# Patient Record
Sex: Female | Born: 1950
Health system: Southern US, Community
[De-identification: ages and names within clinical notes are randomized; demographics above are authoritative.]

## PROBLEM LIST (undated history)

## (undated) DIAGNOSIS — Z9889 Other specified postprocedural states: Secondary | ICD-10-CM

## (undated) DIAGNOSIS — H9190 Unspecified hearing loss, unspecified ear: Secondary | ICD-10-CM

## (undated) DIAGNOSIS — R112 Nausea with vomiting, unspecified: Secondary | ICD-10-CM

## (undated) DIAGNOSIS — C44622 Squamous cell carcinoma of skin of right upper limb, including shoulder: Secondary | ICD-10-CM

## (undated) DIAGNOSIS — N926 Irregular menstruation, unspecified: Secondary | ICD-10-CM

## (undated) DIAGNOSIS — R87619 Unspecified abnormal cytological findings in specimens from cervix uteri: Secondary | ICD-10-CM

## (undated) DIAGNOSIS — L989 Disorder of the skin and subcutaneous tissue, unspecified: Secondary | ICD-10-CM

## (undated) DIAGNOSIS — E785 Hyperlipidemia, unspecified: Secondary | ICD-10-CM

## (undated) HISTORY — DX: Irregular menstruation, unspecified: N92.6

## (undated) HISTORY — DX: Hyperlipidemia, unspecified: E78.5

## (undated) HISTORY — DX: Unspecified abnormal cytological findings in specimens from cervix uteri: R87.619

## (undated) HISTORY — DX: Squamous cell carcinoma of skin of right upper limb, including shoulder: C44.622

## (undated) HISTORY — DX: Disorder of the skin and subcutaneous tissue, unspecified: L98.9

## (undated) HISTORY — DX: Unspecified hearing loss, unspecified ear: H91.90

---

## 1989-02-17 HISTORY — PX: TUBAL LIGATION: SHX77

## 1993-02-17 HISTORY — PX: BREAST LUMPECTOMY: SHX2

## 1996-02-18 HISTORY — PX: COLPOSCOPY: SHX161

## 1998-11-19 ENCOUNTER — Other Ambulatory Visit: Admission: RE | Admit: 1998-11-19 | Discharge: 1998-11-19 | Payer: Self-pay | Admitting: Obstetrics and Gynecology

## 1998-12-19 HISTORY — PX: COLPOSCOPY: SHX161

## 1998-12-27 ENCOUNTER — Encounter (INDEPENDENT_AMBULATORY_CARE_PROVIDER_SITE_OTHER): Payer: Self-pay

## 1998-12-27 ENCOUNTER — Other Ambulatory Visit: Admission: RE | Admit: 1998-12-27 | Discharge: 1998-12-27 | Payer: Self-pay | Admitting: Obstetrics and Gynecology

## 1999-06-06 ENCOUNTER — Other Ambulatory Visit: Admission: RE | Admit: 1999-06-06 | Discharge: 1999-06-06 | Payer: Self-pay | Admitting: *Deleted

## 1999-11-19 ENCOUNTER — Other Ambulatory Visit: Admission: RE | Admit: 1999-11-19 | Discharge: 1999-11-19 | Payer: Self-pay | Admitting: *Deleted

## 2000-08-04 ENCOUNTER — Other Ambulatory Visit: Admission: RE | Admit: 2000-08-04 | Discharge: 2000-08-04 | Payer: Self-pay | Admitting: Obstetrics and Gynecology

## 2001-04-07 ENCOUNTER — Other Ambulatory Visit: Admission: RE | Admit: 2001-04-07 | Discharge: 2001-04-07 | Payer: Self-pay | Admitting: Obstetrics and Gynecology

## 2001-10-12 ENCOUNTER — Other Ambulatory Visit: Admission: RE | Admit: 2001-10-12 | Discharge: 2001-10-12 | Payer: Self-pay | Admitting: Obstetrics and Gynecology

## 2002-11-02 ENCOUNTER — Other Ambulatory Visit: Admission: RE | Admit: 2002-11-02 | Discharge: 2002-11-02 | Payer: Self-pay | Admitting: Obstetrics and Gynecology

## 2004-02-06 ENCOUNTER — Other Ambulatory Visit: Admission: RE | Admit: 2004-02-06 | Discharge: 2004-02-06 | Payer: Self-pay | Admitting: Obstetrics and Gynecology

## 2005-04-22 ENCOUNTER — Other Ambulatory Visit: Admission: RE | Admit: 2005-04-22 | Discharge: 2005-04-22 | Payer: Self-pay | Admitting: Obstetrics and Gynecology

## 2005-07-04 HISTORY — PX: COLONOSCOPY: SHX174

## 2005-07-04 LAB — HM COLONOSCOPY: HM Colonoscopy: NORMAL

## 2006-05-25 ENCOUNTER — Other Ambulatory Visit: Admission: RE | Admit: 2006-05-25 | Discharge: 2006-05-25 | Payer: Self-pay | Admitting: Obstetrics and Gynecology

## 2006-06-19 LAB — HM DEXA SCAN

## 2007-01-18 DIAGNOSIS — L989 Disorder of the skin and subcutaneous tissue, unspecified: Secondary | ICD-10-CM

## 2007-01-18 HISTORY — DX: Disorder of the skin and subcutaneous tissue, unspecified: L98.9

## 2007-06-22 ENCOUNTER — Other Ambulatory Visit: Admission: RE | Admit: 2007-06-22 | Discharge: 2007-06-22 | Payer: Self-pay | Admitting: Obstetrics and Gynecology

## 2012-05-07 ENCOUNTER — Encounter: Payer: Self-pay | Admitting: Family Medicine

## 2012-05-07 ENCOUNTER — Ambulatory Visit (INDEPENDENT_AMBULATORY_CARE_PROVIDER_SITE_OTHER): Payer: BC Managed Care – PPO | Admitting: Family Medicine

## 2012-05-07 VITALS — BP 120/90 | HR 88 | Temp 98.3°F | Resp 16 | Wt 180.0 lb

## 2012-05-07 DIAGNOSIS — IMO0001 Reserved for inherently not codable concepts without codable children: Secondary | ICD-10-CM

## 2012-05-07 DIAGNOSIS — M791 Myalgia, unspecified site: Secondary | ICD-10-CM

## 2012-05-07 NOTE — Progress Notes (Signed)
Subjective:     [name], female   DOB: 09-25-1950, 62 y.o.   MRN: [id number]  HPI 62 year old white female here for followup for her myalgias.  At last office visit she complained of gradually worsening muscle aches in both shoulders and both hips.  He denies any injury, fever, or temporal headache.  She denied any pain in knees, hips, ankles, feet,or hands.  Lab workup included a normal ANA, sed rate, Lyme titers, CBC, CMP.  I asked her to try prednisone 20 mg by mouth daily and follow up today to determine its effect.  She did not get the prednisone.  However she states she is doing better.  Review of Systems  Constitutional: Negative.   HENT: Negative.   Eyes: Negative.   Respiratory: Negative.   Cardiovascular: Negative.   Gastrointestinal: Negative.   Genitourinary: Negative.   Musculoskeletal: Positive for myalgias and arthralgias.       Objective:   Physical Exam  Constitutional: She appears well-developed and well-nourished.  HENT:  Head: Normocephalic and atraumatic.  Neck: Normal range of motion. Neck supple. No thyromegaly present.  Cardiovascular: Normal rate, regular rhythm and normal heart sounds.   Pulmonary/Chest: Effort normal and breath sounds normal. No respiratory distress. She has no wheezes. She has no rales.  Abdominal: Soft. Bowel sounds are normal.  Musculoskeletal: Normal range of motion. She exhibits no edema and no tenderness.       Assessment:     Myalgias-original diagnosis includes PMR versus arthritis.    Plan:    patient would like to just monitor now clinically.  If symptoms worsen she will try a short course of prednisone.  Otherwise she will follow with me annually for a physical exam.  She is going to check her blood pressure daily and notify me if the systolic is greater than 140 or the diastolic remained greater than 90.  She states her blood pressures at home are much better.  She will watch her sodium consumption.  If  blood pressures are elevated we'll initiate treatment.

## 2012-08-05 ENCOUNTER — Encounter: Payer: Self-pay | Admitting: *Deleted

## 2012-08-17 ENCOUNTER — Encounter: Payer: Self-pay | Admitting: Family Medicine

## 2012-08-23 ENCOUNTER — Encounter: Payer: Self-pay | Admitting: Nurse Practitioner

## 2012-08-23 ENCOUNTER — Ambulatory Visit (INDEPENDENT_AMBULATORY_CARE_PROVIDER_SITE_OTHER): Payer: BC Managed Care – PPO | Admitting: Nurse Practitioner

## 2012-08-23 VITALS — BP 122/60 | HR 74 | Resp 14 | Ht 67.5 in | Wt 176.6 lb

## 2012-08-23 DIAGNOSIS — Z0000 Encounter for general adult medical examination without abnormal findings: Secondary | ICD-10-CM

## 2012-08-23 DIAGNOSIS — Z01419 Encounter for gynecological examination (general) (routine) without abnormal findings: Secondary | ICD-10-CM

## 2012-08-23 DIAGNOSIS — E559 Vitamin D deficiency, unspecified: Secondary | ICD-10-CM

## 2012-08-23 LAB — COMPREHENSIVE METABOLIC PANEL
ALT: 17 U/L (ref 0–35)
AST: 21 U/L (ref 0–37)
Albumin: 4.3 g/dL (ref 3.5–5.2)
Alkaline Phosphatase: 84 U/L (ref 39–117)
BUN: 12 mg/dL (ref 6–23)
Calcium: 9.7 mg/dL (ref 8.4–10.5)
Chloride: 104 mEq/L (ref 96–112)
Potassium: 4.6 mEq/L (ref 3.5–5.3)
Sodium: 141 mEq/L (ref 135–145)

## 2012-08-23 LAB — POCT URINALYSIS DIPSTICK
Spec Grav, UA: 1.02
Urobilinogen, UA: NEGATIVE
pH, UA: 5.5

## 2012-08-23 LAB — LIPID PANEL
LDL Cholesterol: 170 mg/dL — ABNORMAL HIGH (ref 0–99)
Total CHOL/HDL Ratio: 5 Ratio
VLDL: 23 mg/dL (ref 0–40)

## 2012-08-23 NOTE — Progress Notes (Signed)
62 y.o. G2P2 Married Caucasian Fe here for annual exam.  She feels well. No new health problems.  No LMP recorded. Patient is postmenopausal.          Sexually active: yes  The current method of family planning is tubal ligation.    Exercising: yes  walking and gardening Smoker:  no  Health Maintenance: Pap:  07/31/2011 normal with negative HR HPV MMG:  07/2012 normal Colonoscopy:  2007 2 polyps and recheck in 10 years BMD:   06/19/2006 normal TDaP:  07/29/2010 Labs: Hgb- 13.0    reports that she has never smoked. She has never used smokeless tobacco. She reports that she does not drink alcohol or use illicit drugs.  Past Medical History  Diagnosis Date  . Melanoma 01/2007    skin lesion removed from left leg  . Irregular menses   . Hearing loss     bilateral hearing loss    Past Surgical History  Procedure Laterality Date  . Tubal ligation Bilateral 1991  . Breast lumpectomy Left 1995    benign  . Colonoscopy  07/04/2005  . Colposcopy  02/1996  . Colposcopy  12/1998    benign    Current Outpatient Prescriptions  Medication Sig Dispense Refill  . acetaminophen (TYLENOL) 100 MG/ML solution Take 10 mg/kg by mouth every 4 (four) hours as needed for fever.      . Multiple Vitamin (MULTIVITAMIN) tablet Take 1 tablet by mouth daily.      . Vitamin D, Ergocalciferol, (DRISDOL) 50000 UNITS CAPS Take 50,000 Units by mouth every 7 (seven) days.       No current facility-administered medications for this visit.    Family History  Problem Relation Age of Onset  . Hypertension Father   . Cancer Paternal Aunt 61    colon cancer  . Breast cancer Paternal Aunt   . Diabetes Paternal Grandmother     ROS:  Pertinent items are noted in HPI.  Otherwise, a comprehensive ROS was negative.  Exam:   BP 122/60  Pulse 74  Resp 14  Ht 5' 7.5" (1.715 m)  Wt 176 lb 9.6 oz (80.105 kg)  BMI 27.24 kg/m2 Height: 5' 7.5" (171.5 cm)  Ht Readings from Last 3 Encounters:  08/23/12 5' 7.5" (1.715  m)    General appearance: alert, cooperative and appears stated age Head: Normocephalic, without obvious abnormality, atraumatic Neck: no adenopathy, supple, symmetrical, trachea midline and thyroid normal to inspection and palpation Lungs: clear to auscultation bilaterally Breasts: normal appearance, no masses or tenderness Heart: regular rate and rhythm Abdomen: soft, non-tender; no masses,  no organomegaly Extremities: extremities normal, atraumatic, no cyanosis or edema Skin: Skin color, texture, turgor normal. No rashes or lesions Lymph nodes: Cervical, supraclavicular, and axillary nodes normal. No abnormal inguinal nodes palpated Neurologic: Grossly normal   Pelvic: External genitalia:  no lesions              Urethra:  normal appearing urethra with no masses, tenderness or lesions              Bartholin's and Skene's: normal                 Vagina: normal appearing vagina with normal color and discharge, no lesions              Cervix: anteverted              Pap taken: no Bimanual Exam:  Uterus:  normal size, contour, position, consistency, mobility,  non-tender              Adnexa: no mass, fullness, tenderness               Rectovaginal: Confirms               Anus:  normal sphincter tone, no lesions  A:  Well Woman with normal exam  Postmenopausal no HRT  Vit D deficiency  History of bilateral hearing loss  Personal history of colon polyps  Remote history of abnormal pap with negative Colpo biopsy 11/00    P:   Pap smear as per guidelines   Mammogram due 6/15  IFOB given  Follow with labs return annually or prn  An After Visit Summary was printed and given to the patient.

## 2012-08-23 NOTE — Patient Instructions (Addendum)

## 2012-08-24 ENCOUNTER — Encounter: Payer: Self-pay | Admitting: Nurse Practitioner

## 2012-08-24 NOTE — Progress Notes (Signed)
Encounter reviewed by Dr. Brook Silva.  

## 2012-09-02 ENCOUNTER — Telehealth: Payer: Self-pay | Admitting: *Deleted

## 2012-09-02 NOTE — Telephone Encounter (Signed)
Pt is aware of all lab results and will consult her PCP about cholesterol. Pt is agreeable to Vitamin D 600-800 IU 1 po qd (otc).

## 2012-09-02 NOTE — Telephone Encounter (Signed)
Message copied by Osie Bond on Thu Sep 02, 2012  1:35 PM ------      Message from: Ria Comment R      Created: Thu Sep 02, 2012  8:56 AM       Let patient know that total cholesterol and LDL is still elevated - would recommend that she see PCP about further treatment options .All else is good. Follow Vit D per protocol. ------

## 2012-09-07 ENCOUNTER — Encounter: Payer: Self-pay | Admitting: Family Medicine

## 2012-11-25 ENCOUNTER — Ambulatory Visit (INDEPENDENT_AMBULATORY_CARE_PROVIDER_SITE_OTHER): Payer: BC Managed Care – PPO | Admitting: *Deleted

## 2012-11-25 VITALS — Temp 97.2°F

## 2012-11-25 DIAGNOSIS — Z23 Encounter for immunization: Secondary | ICD-10-CM

## 2012-12-21 NOTE — Addendum Note (Signed)
Addended by: Lorraine Lax on: 12/21/2012 09:30 AM   Modules accepted: Orders

## 2013-02-04 ENCOUNTER — Ambulatory Visit (INDEPENDENT_AMBULATORY_CARE_PROVIDER_SITE_OTHER): Payer: BC Managed Care – PPO | Admitting: Family Medicine

## 2013-02-04 ENCOUNTER — Encounter: Payer: Self-pay | Admitting: Family Medicine

## 2013-02-04 VITALS — BP 138/88 | HR 78 | Temp 98.6°F | Resp 18 | Ht 67.0 in | Wt 172.0 lb

## 2013-02-04 DIAGNOSIS — R030 Elevated blood-pressure reading, without diagnosis of hypertension: Secondary | ICD-10-CM | POA: Insufficient documentation

## 2013-02-04 DIAGNOSIS — E785 Hyperlipidemia, unspecified: Secondary | ICD-10-CM

## 2013-02-04 NOTE — Patient Instructions (Addendum)
F/U 3 months - Fasting  DASH Diet The DASH diet stands for "Dietary Approaches to Stop Hypertension." It is a healthy eating plan that has been shown to reduce high blood pressure (hypertension) in as little as 14 days, while also possibly providing other significant health benefits. These other health benefits include reducing the risk of breast cancer after menopause and reducing the risk of type 2 diabetes, heart disease, colon cancer, and stroke. Health benefits also include weight loss and slowing kidney failure in patients with chronic kidney disease.  DIET GUIDELINES  Limit salt (sodium). Your diet should contain less than 1500 mg of sodium daily.  Limit refined or processed carbohydrates. Your diet should include mostly whole grains. Desserts and added sugars should be used sparingly.  Include small amounts of heart-healthy fats. These types of fats include nuts, oils, and tub margarine. Limit saturated and trans fats. These fats have been shown to be harmful in the body. CHOOSING FOODS  The following food groups are based on a 2000 calorie diet. See your Registered Dietitian for individual calorie needs. Grains and Grain Products (6 to 8 servings daily)  Eat More Often: Whole-wheat bread, brown rice, whole-grain or wheat pasta, quinoa, popcorn without added fat or salt (air popped).  Eat Less Often: White bread, white pasta, white rice, cornbread. Vegetables (4 to 5 servings daily)  Eat More Often: Fresh, frozen, and canned vegetables. Vegetables may be raw, steamed, roasted, or grilled with a minimal amount of fat.  Eat Less Often/Avoid: Creamed or fried vegetables. Vegetables in a cheese sauce. Fruit (4 to 5 servings daily)  Eat More Often: All fresh, canned (in natural juice), or frozen fruits. Dried fruits without added sugar. One hundred percent fruit juice ( cup [237 mL] daily).  Eat Less Often: Dried fruits with added sugar. Canned fruit in light or heavy syrup. [employer], Fish, and Poultry (2 servings or less daily. One serving is 3 to 4 oz [85-114 g]).  Eat More Often: Ninety percent or leaner ground beef, tenderloin, sirloin. Round cuts of beef, chicken breast, [place] breast. All fish. Grill, bake, or broil your meat. Nothing should be fried.  Eat Less Often/Avoid: Fatty cuts of meat, [place], or chicken leg, thigh, or wing. Fried cuts of meat or fish. Dairy (2 to 3 servings)  Eat More Often: Low-fat or fat-free milk, low-fat plain or light yogurt, reduced-fat or part-skim cheese.  Eat Less Often/Avoid: Milk (whole, 2%).Whole milk yogurt. Full-fat cheeses. Nuts, Seeds, and Legumes (4 to 5 servings per week)  Eat More Often: All without added salt.  Eat Less Often/Avoid: Salted nuts and seeds, canned beans with added salt. Fats and Sweets (limited)  Eat More Often: Vegetable oils, tub margarines without trans fats, sugar-free gelatin. Mayonnaise and salad dressings.  Eat Less Often/Avoid: Coconut oils, palm oils, butter, stick margarine, cream, half and half, cookies, candy, pie. FOR MORE INFORMATION The Dash Diet Eating Plan: www.dashdiet.org Document Released: 01/23/2011 Document Revised: 04/28/2011 Document Reviewed: 01/23/2011 [facility] Patient Information 2014 [place], [place]. DASH Diet The DASH diet stands for "Dietary Approaches to Stop Hypertension." It is a healthy eating plan that has been shown to reduce high blood pressure (hypertension) in as little as 14 days, while also possibly providing other significant health benefits. These other health benefits include reducing the risk of breast cancer after menopause and reducing the risk of type 2 diabetes, heart disease, colon cancer, and stroke. Health benefits also include weight loss and slowing kidney failure in patients with  chronic kidney disease.  DIET GUIDELINES  Limit salt (sodium). Your diet should contain less than 1500 mg of sodium daily.  Limit refined or processed  carbohydrates. Your diet should include mostly whole grains. Desserts and added sugars should be used sparingly.  Include small amounts of heart-healthy fats. These types of fats include nuts, oils, and tub margarine. Limit saturated and trans fats. These fats have been shown to be harmful in the body. CHOOSING FOODS  The following food groups are based on a 2000 calorie diet. See your Registered Dietitian for individual calorie needs. Grains and Grain Products (6 to 8 servings daily)  Eat More Often: Whole-wheat bread, brown rice, whole-grain or wheat pasta, quinoa, popcorn without added fat or salt (air popped).  Eat Less Often: White bread, white pasta, white rice, cornbread. Vegetables (4 to 5 servings daily)  Eat More Often: Fresh, frozen, and canned vegetables. Vegetables may be raw, steamed, roasted, or grilled with a minimal amount of fat.  Eat Less Often/Avoid: Creamed or fried vegetables. Vegetables in a cheese sauce. Fruit (4 to 5 servings daily)  Eat More Often: All fresh, canned (in natural juice), or frozen fruits. Dried fruits without added sugar. One hundred percent fruit juice ( cup [237 mL] daily).  Eat Less Often: Dried fruits with added sugar. Canned fruit in light or heavy syrup. [employer], Fish, and Poultry (2 servings or less daily. One serving is 3 to 4 oz [85-114 g]).  Eat More Often: Ninety percent or leaner ground beef, tenderloin, sirloin. Round cuts of beef, chicken breast, [place] breast. All fish. Grill, bake, or broil your meat. Nothing should be fried.  Eat Less Often/Avoid: Fatty cuts of meat, [place], or chicken leg, thigh, or wing. Fried cuts of meat or fish. Dairy (2 to 3 servings)  Eat More Often: Low-fat or fat-free milk, low-fat plain or light yogurt, reduced-fat or part-skim cheese.  Eat Less Often/Avoid: Milk (whole, 2%).Whole milk yogurt. Full-fat cheeses. Nuts, Seeds, and Legumes (4 to 5 servings per week)  Eat More Often: All without added  salt.  Eat Less Often/Avoid: Salted nuts and seeds, canned beans with added salt. Fats and Sweets (limited)  Eat More Often: Vegetable oils, tub margarines without trans fats, sugar-free gelatin. Mayonnaise and salad dressings.  Eat Less Often/Avoid: Coconut oils, palm oils, butter, stick margarine, cream, half and half, cookies, candy, pie. FOR MORE INFORMATION The Dash Diet Eating Plan: www.dashdiet.org Document Released: 01/23/2011 Document Revised: 04/28/2011 Document Reviewed: 01/23/2011 [facility] Patient Information 2014 [place], [place].

## 2013-02-04 NOTE — Progress Notes (Signed)
   Subjective:    [name], female    DOB: [birth year], 61 y.o.   MRN: [id number]  HPI  Patient here to followup her blood pressure. On her last visit with PCP her diastolic blood pressure was elevated around 90. She's been checking her blood pressures at home over the past couple of months. She did have appeared time between June and September where she was caring for her mother therefore she has no readings. Otherwise her blood pressures have been 120s to 154 over 60s to 80. The past few weeks her blood pressures on average have been 144/70's. She's not had any chest pain or shortness of breath or headache associated. She understands she has been under some stress caring for her mother as well as daily stressors. She tries to eat well and she walks 2 miles a day. Her fasting labs were reviewed from her GI and which did show hyperlipidemia with an LDL of 170. She does have family history of hypertension in her father and her brother  Review of Systems  GEN- denies fatigue, fever, weight loss,weakness, recent illness HEENT- denies eye drainage, change in vision, nasal discharge, CVS- denies chest pain, palpitations RESP- denies SOB, cough, wheeze ABD- denies N/V, change in stools, abd pain GU- denies dysuria, hematuria, dribbling, incontinence MSK- denies joint pain, muscle aches, injury Neuro- denies headache, dizziness, syncope, seizure activity      Objective:   Physical Exam GEN- NAD, alert and oriented x3 HEENT- PERRL, EOMI, non injected sclera, pink conjunctiva, MMM, oropharynx clear CVS- RRR, no murmur RESP-CTAB EXT- No edema Pulses- Radial 2+        Assessment & Plan:

## 2013-02-04 NOTE — Assessment & Plan Note (Signed)
Her blood pressure is minimally elevated in office today. She has had a few elevated blood pressures at home. We discussed more south changes that she can do to bring her blood pressure down into protect her from complications of hypertension. We discussed low sat diet as well as aerobic exercise and stress relief. Do not think that she needs medications at this time. She will continue to monitor her blood pressure and will followup in 3 months time she will also need fasting labs

## 2013-02-04 NOTE — Assessment & Plan Note (Signed)
Fasting labs will be done patient will work on dietary changes low fat diet.

## 2013-05-06 ENCOUNTER — Encounter: Payer: Self-pay | Admitting: Family Medicine

## 2013-05-06 ENCOUNTER — Ambulatory Visit (INDEPENDENT_AMBULATORY_CARE_PROVIDER_SITE_OTHER): Payer: BC Managed Care – PPO | Admitting: Family Medicine

## 2013-05-06 VITALS — BP 138/62 | HR 76 | Temp 98.2°F | Resp 14 | Ht 68.0 in | Wt 176.0 lb

## 2013-05-06 DIAGNOSIS — E785 Hyperlipidemia, unspecified: Secondary | ICD-10-CM

## 2013-05-06 DIAGNOSIS — E559 Vitamin D deficiency, unspecified: Secondary | ICD-10-CM | POA: Insufficient documentation

## 2013-05-06 DIAGNOSIS — R030 Elevated blood-pressure reading, without diagnosis of hypertension: Secondary | ICD-10-CM

## 2013-05-06 LAB — CBC WITH DIFFERENTIAL/PLATELET
Basophils Absolute: 0 10*3/uL (ref 0.0–0.1)
Basophils Relative: 1 % (ref 0–1)
Eosinophils Absolute: 0.1 10*3/uL (ref 0.0–0.7)
Eosinophils Relative: 3 % (ref 0–5)
HEMATOCRIT: 39.2 % (ref 36.0–46.0)
HEMOGLOBIN: 13.1 g/dL (ref 12.0–15.0)
LYMPHS ABS: 1.4 10*3/uL (ref 0.7–4.0)
Lymphocytes Relative: 30 % (ref 12–46)
MCH: 28.7 pg (ref 26.0–34.0)
MCHC: 33.4 g/dL (ref 30.0–36.0)
MCV: 85.8 fL (ref 78.0–100.0)
MONO ABS: 0.3 10*3/uL (ref 0.1–1.0)
MONOS PCT: 6 % (ref 3–12)
NEUTROS ABS: 2.9 10*3/uL (ref 1.7–7.7)
NEUTROS PCT: 60 % (ref 43–77)
Platelets: 250 10*3/uL (ref 150–400)
RBC: 4.57 MIL/uL (ref 3.87–5.11)
RDW: 13.6 % (ref 11.5–15.5)
WBC: 4.8 10*3/uL (ref 4.0–10.5)

## 2013-05-06 LAB — COMPREHENSIVE METABOLIC PANEL
ALBUMIN: 4.1 g/dL (ref 3.5–5.2)
ALT: 15 U/L (ref 0–35)
AST: 21 U/L (ref 0–37)
Alkaline Phosphatase: 90 U/L (ref 39–117)
BUN: 11 mg/dL (ref 6–23)
CALCIUM: 9.5 mg/dL (ref 8.4–10.5)
CHLORIDE: 102 meq/L (ref 96–112)
CO2: 29 meq/L (ref 19–32)
Creat: 0.9 mg/dL (ref 0.50–1.10)
GLUCOSE: 93 mg/dL (ref 70–99)
POTASSIUM: 4.7 meq/L (ref 3.5–5.3)
SODIUM: 140 meq/L (ref 135–145)
TOTAL PROTEIN: 6.8 g/dL (ref 6.0–8.3)
Total Bilirubin: 0.4 mg/dL (ref 0.2–1.2)

## 2013-05-06 LAB — LIPID PANEL
CHOLESTEROL: 223 mg/dL — AB (ref 0–200)
HDL: 53 mg/dL (ref >39)
LDL CALC: 150 mg/dL — AB (ref 0–99)
TRIGLYCERIDES: 101 mg/dL (ref <150)
Total CHOL/HDL Ratio: 4.2 Ratio
VLDL: 20 mg/dL (ref 0–40)

## 2013-05-06 NOTE — Patient Instructions (Signed)
Continue current vitamins We will send letter with lab results F/U 1 year or as needed

## 2013-05-06 NOTE — Progress Notes (Signed)
[name], female   DOB: 1950/09/25, 63 y.o.   MRN: [id number]   Subjective:    [name], female    DOB: 1950/09/09, 63 y.o.   MRN: [id number]  Patient presents for 3 month F/U  patient here to followup hypertension. She's no specific concerns today. She has been checking her blood pressure randomly at home her blood pressure has ranged 122-145/70-76 she's not had any headaches or dizziness. She states that she feels well she is trying to be active and eat healthy. She has gained 4 pounds since her last visit. She's due for fasting labs she is history of hyperlipidemia. She also has history of vitamin D deficiency and is due for repeat vitamin D level currently taking a maintenance dose of 1000 international units a day     Review Of Systems:  GEN- denies fatigue, fever, weight loss,weakness, recent illness HEENT- denies eye drainage, change in vision, nasal discharge, CVS- denies chest pain, palpitations RESP- denies SOB, cough, wheeze ABD- denies N/V, change in stools, abd pain GU- denies dysuria, hematuria, dribbling, incontinence MSK- denies joint pain, muscle aches, injury Neuro- denies headache, dizziness, syncope, seizure activity       Objective:    BP 138/62  Pulse 76  Temp(Src) 98.2 F (36.8 C)  Resp 14  Ht 5\' 8"  (1.727 m)  Wt 176 lb (79.833 kg)  BMI 26.77 kg/m2 GEN- NAD, alert and oriented x3 HEENT- PERRL, EOMI, non injected sclera, pink conjunctiva, MMM, oropharynx clear Neck- Supple, no thyromegaly CVS- RRR, no murmur RESP-CTAB EXT- No edema Pulses- Radial, DP- 2+        Assessment & Plan:      Problem List Items Addressed This Visit   Unspecified vitamin D deficiency   Relevant Orders      Vit D  25 hydroxy (rtn osteoporosis monitoring)   Other and unspecified hyperlipidemia - Primary     Check FLP    Relevant Orders      CBC with Differential      Comprehensive metabolic panel      Lipid panel   Elevated  blood pressure (not hypertension)     Blood pressure are wel controlled, no signs of htn, she can continue to monitor as needed Fasting labs toda       Note: This dictation was prepared with Dragon dictation along with smaller phrase technology. Any transcriptional errors that result from this process are unintentional.

## 2013-05-06 NOTE — Assessment & Plan Note (Signed)
Blood pressure are wel controlled, no signs of htn, she can continue to monitor as needed Fasting labs toda

## 2013-05-06 NOTE — Assessment & Plan Note (Signed)
Check FLP 

## 2013-05-07 LAB — VITAMIN D 25 HYDROXY (VIT D DEFICIENCY, FRACTURES): Vit D, 25-Hydroxy: 53 ng/mL (ref 30–89)

## 2013-05-09 ENCOUNTER — Other Ambulatory Visit: Payer: Self-pay | Admitting: *Deleted

## 2013-05-09 DIAGNOSIS — E785 Hyperlipidemia, unspecified: Secondary | ICD-10-CM

## 2013-09-13 ENCOUNTER — Encounter: Payer: Self-pay | Admitting: Family Medicine

## 2013-10-17 ENCOUNTER — Other Ambulatory Visit: Payer: BC Managed Care – PPO

## 2013-10-26 ENCOUNTER — Ambulatory Visit (INDEPENDENT_AMBULATORY_CARE_PROVIDER_SITE_OTHER): Payer: BC Managed Care – PPO | Admitting: Nurse Practitioner

## 2013-10-26 ENCOUNTER — Encounter: Payer: Self-pay | Admitting: Nurse Practitioner

## 2013-10-26 VITALS — BP 124/80 | HR 76 | Ht 67.75 in | Wt 172.0 lb

## 2013-10-26 DIAGNOSIS — E2839 Other primary ovarian failure: Secondary | ICD-10-CM

## 2013-10-26 DIAGNOSIS — Z0000 Encounter for general adult medical examination without abnormal findings: Secondary | ICD-10-CM

## 2013-10-26 DIAGNOSIS — R3915 Urgency of urination: Secondary | ICD-10-CM

## 2013-10-26 DIAGNOSIS — Z01419 Encounter for gynecological examination (general) (routine) without abnormal findings: Secondary | ICD-10-CM

## 2013-10-26 DIAGNOSIS — Z1211 Encounter for screening for malignant neoplasm of colon: Secondary | ICD-10-CM

## 2013-10-26 LAB — POCT URINALYSIS DIPSTICK
Bilirubin, UA: NEGATIVE
Blood, UA: NEGATIVE
Glucose, UA: NEGATIVE
KETONES UA: NEGATIVE
Nitrite, UA: NEGATIVE
Protein, UA: NEGATIVE
Urobilinogen, UA: NEGATIVE
pH, UA: 7

## 2013-10-26 NOTE — Progress Notes (Signed)
[name], female   DOB: [birth year], 63 y.o.   MRN: [id number] 63 y.o. G2P2 Married Caucasian Fe here for annual exam.  No new health problems.  Patient's last menstrual period was 11/17/2001.          Sexually active: yes  The current method of family planning is tubal ligation.  Exercising: yes walking 2 miles 5 times per week and gardening  Smoker: no   Health Maintenance:  Pap: 07/31/2011 normal with negative HR HPV  MMG: 08/29/13, Bi-Rads 1:  Negative  Colonoscopy: 2007 2 polyps and recheck in 10 years, IFOB given BMD: 06/19/2006 normal  TDaP: 07/29/2010  Labs:  HB:  PCP  Urine:  Trace WBC   reports that she has never smoked. She has never used smokeless tobacco. She reports that she does not drink alcohol or use illicit drugs.  Past Medical History  Diagnosis Date  . Irregular menses   . Hearing loss since childhood    bilateral hearing loss  . Skin lesion of left leg 12/08    atypical    Past Surgical History  Procedure Laterality Date  . Tubal ligation Bilateral 1991  . Breast lumpectomy Left 1995    benign  . Colonoscopy  07/04/2005    recheck in 10 years - Dr Collene Mares  . Colposcopy  02/1996    benign  . Colposcopy  12/1998    benign    Current Outpatient Prescriptions  Medication Sig Dispense Refill  . acetaminophen (TYLENOL) 100 MG/ML solution Take 10 mg/kg by mouth every 4 (four) hours as needed for fever.      . cholecalciferol (VITAMIN D) 1000 UNITS tablet Take 1,000 Units by mouth daily. 2 tabs Daily      . Multiple Vitamin (MULTIVITAMIN) tablet Take 1 tablet by mouth daily.       No current facility-administered medications for this visit.    Family History  Problem Relation Age of Onset  . Hypertension Father   . Cancer Paternal Aunt 75    colon cancer  . Diabetes Paternal Grandmother   . Hypertension Mother   . Thyroid disease Mother   . Hypertension Brother   . Breast cancer Paternal Aunt     ROS:  Pertinent items are noted in HPI.   Otherwise, a comprehensive ROS was negative.  Exam:   BP 124/80  Pulse 76  Ht 5' 7.75" (1.721 m)  Wt 172 lb (78.019 kg)  BMI 26.34 kg/m2  LMP 11/17/2001 Height: 5' 7.75" (172.1 cm)  Ht Readings from Last 3 Encounters:  10/26/13 5' 7.75" (1.721 m)  05/06/13 5\' 8"  (1.727 m)  02/04/13 5\' 7"  (1.702 m)    General appearance: alert, cooperative and appears stated age Head: Normocephalic, without obvious abnormality, atraumatic Neck: no adenopathy, supple, symmetrical, trachea midline and thyroid normal to inspection and palpation Lungs: clear to auscultation bilaterally Breasts: normal appearance, no masses or tenderness Heart: regular rate and rhythm Abdomen: soft, non-tender; no masses,  no organomegaly Extremities: extremities normal, atraumatic, no cyanosis or edema Skin: Skin color, texture, turgor normal. No rashes or lesions Lymph nodes: Cervical, supraclavicular, and axillary nodes normal. No abnormal inguinal nodes palpated Neurologic: Grossly normal   Pelvic: External genitalia:  no lesions              Urethra:  normal appearing urethra with no masses, tenderness or lesions              Bartholin's and Skene's: normal  Vagina: normal appearing vagina with normal color and discharge, no lesions              Cervix: anteverted              Pap taken: No. Bimanual Exam:  Uterus:  normal size, contour, position, consistency, mobility, non-tender              Adnexa: no mass, fullness, tenderness               Rectovaginal: Confirms               Anus:  normal sphincter tone, no lesions  A:  Well Woman with normal exam  Postmenopausal  R/O UTI   P:   Reviewed health and wellness pertinent to exam  Pap smear not taken today  Mammogram is due 08/2014  Order is placed for BMD  Will follow with urine culture  Counseled on breast self exam, mammography screening, adequate intake of calcium and vitamin D, diet and exercise return annually or prn  An After  Visit Summary was printed and given to the patient.

## 2013-10-26 NOTE — Patient Instructions (Signed)

## 2013-10-27 LAB — URINE CULTURE

## 2013-10-30 NOTE — Progress Notes (Signed)
Encounter reviewed by Dr. Decorian Schuenemann Silva.  

## 2013-11-17 ENCOUNTER — Ambulatory Visit (INDEPENDENT_AMBULATORY_CARE_PROVIDER_SITE_OTHER): Payer: BC Managed Care – PPO | Admitting: Family Medicine

## 2013-11-17 ENCOUNTER — Other Ambulatory Visit: Payer: BC Managed Care – PPO

## 2013-11-17 ENCOUNTER — Other Ambulatory Visit: Payer: Self-pay | Admitting: Family Medicine

## 2013-11-17 DIAGNOSIS — E785 Hyperlipidemia, unspecified: Secondary | ICD-10-CM

## 2013-11-17 DIAGNOSIS — Z23 Encounter for immunization: Secondary | ICD-10-CM

## 2013-11-17 LAB — BASIC METABOLIC PANEL
BUN: 13 mg/dL (ref 6–23)
CALCIUM: 9.4 mg/dL (ref 8.4–10.5)
CHLORIDE: 105 meq/L (ref 96–112)
CO2: 27 meq/L (ref 19–32)
Creat: 0.88 mg/dL (ref 0.50–1.10)
GLUCOSE: 83 mg/dL (ref 70–99)
Potassium: 4.5 mEq/L (ref 3.5–5.3)
Sodium: 142 mEq/L (ref 135–145)

## 2013-11-23 ENCOUNTER — Telehealth: Payer: Self-pay | Admitting: Family Medicine

## 2013-11-23 NOTE — Telephone Encounter (Signed)
Let pt know her GYN ordered this The metaolic panel was normal- kidney function , glucose and potassium

## 2013-11-23 NOTE — Telephone Encounter (Signed)
MD please advise

## 2013-11-23 NOTE — Telephone Encounter (Signed)
Call placed to patient.   During conversation she states that she was supposed to have lpid panel instead of BMP.   MD please advise.

## 2013-11-23 NOTE — Telephone Encounter (Signed)
My mistake regarding GYN, it seems our office ordered the wrong test a BMP was pulled instead of a Lipid Panel Please see if [facility] can add on the lipid panel, if not pt needs to have redrawn but should not have to pay for the test as it was our error.  I will send this to our Office manager to help facilitate this

## 2013-11-23 NOTE — Telephone Encounter (Signed)
Patient is calling to see if lab results were in [phone]

## 2013-11-23 NOTE — Telephone Encounter (Signed)
Call placed to patient and patient made aware.  

## 2013-11-24 LAB — LIPID PANEL
Cholesterol: 204 mg/dL — ABNORMAL HIGH (ref 0–200)
HDL: 51 mg/dL (ref >39)
LDL Cholesterol: 130 mg/dL — ABNORMAL HIGH (ref 0–99)
Total CHOL/HDL Ratio: 4 Ratio
Triglycerides: 113 mg/dL (ref <150)
VLDL: 23 mg/dL (ref 0–40)

## 2013-11-24 NOTE — Telephone Encounter (Signed)
Lab aware and will call to see if we can add Lipid Panel.

## 2013-11-24 NOTE — Telephone Encounter (Signed)
Per lab, Lipid panel was added.

## 2013-11-25 NOTE — Telephone Encounter (Signed)
Patient is aware that she might receive a bill for this lab. I did let the patient know that we would fix it on our end since it was drawn in error.

## 2013-12-19 ENCOUNTER — Encounter: Payer: Self-pay | Admitting: Nurse Practitioner

## 2014-02-07 ENCOUNTER — Encounter: Payer: Self-pay | Admitting: Nurse Practitioner

## 2014-02-07 ENCOUNTER — Telehealth: Payer: Self-pay | Admitting: Nurse Practitioner

## 2014-02-07 ENCOUNTER — Ambulatory Visit (INDEPENDENT_AMBULATORY_CARE_PROVIDER_SITE_OTHER): Payer: BC Managed Care – PPO | Admitting: Nurse Practitioner

## 2014-02-07 VITALS — BP 122/86 | HR 88 | Temp 98.0°F | Ht 67.75 in | Wt 178.0 lb

## 2014-02-07 DIAGNOSIS — R350 Frequency of micturition: Secondary | ICD-10-CM

## 2014-02-07 LAB — POCT URINALYSIS DIPSTICK
BILIRUBIN UA: NEGATIVE
Blood, UA: NEGATIVE
Glucose, UA: NEGATIVE
Ketones, UA: NEGATIVE
LEUKOCYTES UA: NEGATIVE
NITRITE UA: NEGATIVE
Protein, UA: NEGATIVE
Urobilinogen, UA: NEGATIVE
pH, UA: 7

## 2014-02-07 MED ORDER — SULFAMETHOXAZOLE-TRIMETHOPRIM 800-160 MG PO TABS: ORAL_TABLET | ORAL | Status: DC

## 2014-02-07 NOTE — Telephone Encounter (Signed)
Pt states she is having frequent urination and abdominal pain lower pelvic area. Pt states not extreme but lasting for several days now. Pt would like to see Patty today.  Please call.  bf

## 2014-02-07 NOTE — Patient Instructions (Signed)

## 2014-02-07 NOTE — Progress Notes (Signed)
S:  @63  y.o. MW Fe presents with complaint of UTI. Symptoms began a week ago. With symptoms of urinary frequency, urinary urgency, pelvic presssure or discomfort..  Pertinent negatives no constitutional symptoms, denying fever, chills, anorexia, or weight loss.  Not SA secondary to husbands problem with ED.  Menopausal and does have some vaginal dryness.  Same partner without change. Last UTI documented in September with mixed colony count.  ROS: no weight loss, fever, night sweats and feels well  O alert, oriented to person, place, and time   healthy,  alert and  not in acute distress  Mild lower abdominal pressure  No CVA tenderness  cervix normal in appearance, external genitalia normal, no bladder tenderness and vagina normal without discharge   Diagnostic Test:    Urinalysis:  negative   urine culture  Assessment:  R/O UTI   urethritis   Plan:   Maintain adequate hydration. Follow up if symptoms not improving, and as needed.  Medication Therapy: Septra DS BID for a week - if urine culture is negative still would like her to complete 3 days treatment for urethritis    Lab:TOC if Urine Culture is positive   RV

## 2014-02-07 NOTE — Telephone Encounter (Signed)
Spoke with patient. She c/o of urinary frequency and is concerned. Would like visit today with Rebecca Howe, [place]. Scheduled office visit today at 1015. Patient agreeable. Routing to provider for final review. Patient agreeable to disposition. Will close encounter

## 2014-02-09 LAB — URINE CULTURE
Colony Count: NO GROWTH
Organism ID, Bacteria: NO GROWTH

## 2014-02-13 NOTE — Progress Notes (Signed)
Encounter reviewed by Dr. Brook Silva.  

## 2014-02-15 LAB — FECAL OCCULT BLOOD, IMMUNOCHEMICAL: FECAL OCCULT BLD: NEGATIVE

## 2014-09-04 ENCOUNTER — Telehealth: Payer: Self-pay | Admitting: Nurse Practitioner

## 2014-09-04 NOTE — Telephone Encounter (Signed)
Note is signed - order was placed @ [facility] on 10/26/13

## 2014-09-04 NOTE — Telephone Encounter (Signed)
Order for BMD to Milford Cage, FNP's desk for review and signature before fax.

## 2014-09-04 NOTE — Telephone Encounter (Signed)
Order for BMD faxed to [facility] with cover sheet and confirmation. Spoke with patient. Advised order has been sent to [facility] for her appointment on 09/07/2014. Patient is agreeable.  Routing to provider for final review. Patient agreeable to disposition. Will close encounter.   Patient aware provider will review message and nurse will return call if any additional advice or change of disposition.

## 2014-09-04 NOTE — Telephone Encounter (Signed)
Patient is requesting an order for Bone Density . This needs to be faxed to [place] [phone] She is scheduled for this Thursday 09/07/14

## 2014-09-18 ENCOUNTER — Telehealth: Payer: Self-pay | Admitting: Nurse Practitioner

## 2014-09-18 NOTE — Telephone Encounter (Signed)
Please let pt know that BMD done on 09/07/14 shows a T Score at the spine of 0.0; left hip neck -0.3; right hip neck -0.9.  All of the these fall into the 'normal range'.  Comparison made to last study 06/19/2006 shows a 2.8% decrease at the spine and a 5.7 % decrease at the left hip.  No comparison studies for the right hip. The FRAX score for risk of major fracture in 10 years is 7.5 (goal is < 20%).  The FRAX score for hip fracture in 10 years is 0.4% (goal is < 3 %).  While some bone loss is normal and expectant in postmenopausal women we do not want the loss to get greater.  She is pretty active, so have her to increase upper body weights at 3-4 times a week and continue with walking.  Needs to maintain calcium and Vit D.  recheck in 2-3 years

## 2014-09-28 NOTE — Telephone Encounter (Signed)
Return call from pt while leaving message to return call.  Results given.  Pt is excited results are normal and agreeable with plan.  Will increase upper body weights.  Closing encounter.

## 2014-10-30 ENCOUNTER — Ambulatory Visit: Payer: BC Managed Care – PPO | Admitting: Nurse Practitioner

## 2014-12-05 ENCOUNTER — Ambulatory Visit (INDEPENDENT_AMBULATORY_CARE_PROVIDER_SITE_OTHER): Payer: BLUE CROSS/BLUE SHIELD | Admitting: Family Medicine

## 2014-12-05 DIAGNOSIS — Z23 Encounter for immunization: Secondary | ICD-10-CM

## 2014-12-12 ENCOUNTER — Ambulatory Visit (INDEPENDENT_AMBULATORY_CARE_PROVIDER_SITE_OTHER): Payer: BLUE CROSS/BLUE SHIELD | Admitting: Family Medicine

## 2014-12-12 ENCOUNTER — Encounter: Payer: Self-pay | Admitting: Family Medicine

## 2014-12-12 VITALS — BP 130/62 | HR 82 | Temp 97.7°F | Resp 16 | Ht 67.75 in | Wt 172.0 lb

## 2014-12-12 DIAGNOSIS — J209 Acute bronchitis, unspecified: Secondary | ICD-10-CM | POA: Diagnosis not present

## 2014-12-12 MED ORDER — HYDROCODONE-HOMATROPINE 5-1.5 MG/5ML PO SYRP: 5.0000 mL | ORAL_SOLUTION | Freq: Three times a day (TID) | ORAL | Status: DC | PRN

## 2014-12-12 NOTE — Progress Notes (Signed)
[name], female   DOB: 1950-06-25, 64 y.o.   MRN: [id number]   Subjective:    [name], female    DOB: December 10, 1950, 64 y.o.   MRN: [id number]  Patient presents for Illness  patient here with nonproductive tachycardia cough for the past 3 weeks. Initially started with sore throat sinus drainage but that is now resolved but she is still coughing. She would like a prescription cough medicine. Besides some fatigue from not sleeping very well she does not feel very sick. She has not had any significant fever. Denies any chest pain or shortness of breath.    Review Of Systems:  GEN- denies fatigue, fever, weight loss,weakness, recent illness HEENT- denies eye drainage, change in vision, nasal discharge, CVS- denies chest pain, palpitations RESP- denies SOB, +cough, wheeze ABD- denies N/V, change in stools, abd pain Neuro- denies headache, dizziness, syncope, seizure activity       Objective:    BP 130/62 mmHg  Pulse 82  Temp(Src) 97.7 F (36.5 C) (Oral)  Resp 16  Ht 5' 7.75" (1.721 m)  Wt 172 lb (78.019 kg)  BMI 26.34 kg/m2  LMP 11/17/2001 GEN- NAD, alert and oriented x3 HEENT- PERRL, EOMI, non injected sclera, pink conjunctiva, MMM, oropharynx clear Neck- Supple, no LAD CVS- RRR, no murmur RESP-CTAB EXT- No edema Pulses- Radial, DP- 2+        Assessment & Plan:      Problem List Items Addressed This Visit    None    Visit Diagnoses    Acute bronchitis, unspecified organism    -  Primary    Treat with hycodan cough syrup, I think she has a little post bronchitic cough from recent URI, looks well otherwise, no antibiotics needed. Mucinex during day        Note: This dictation was prepared with Dragon dictation along with smaller phrase technology. Any transcriptional errors that result from this process are unintentional.

## 2014-12-12 NOTE — Patient Instructions (Signed)
Use mucinex during the day  Take cough medicine as prescribed  F/U as previous

## 2014-12-20 ENCOUNTER — Ambulatory Visit (INDEPENDENT_AMBULATORY_CARE_PROVIDER_SITE_OTHER): Payer: BLUE CROSS/BLUE SHIELD | Admitting: Nurse Practitioner

## 2014-12-20 ENCOUNTER — Encounter: Payer: Self-pay | Admitting: Nurse Practitioner

## 2014-12-20 VITALS — BP 112/72 | HR 76 | Ht 67.25 in | Wt 172.0 lb

## 2014-12-20 DIAGNOSIS — Z0000 Encounter for general adult medical examination without abnormal findings: Secondary | ICD-10-CM | POA: Diagnosis not present

## 2014-12-20 DIAGNOSIS — Z01419 Encounter for gynecological examination (general) (routine) without abnormal findings: Secondary | ICD-10-CM | POA: Diagnosis not present

## 2014-12-20 DIAGNOSIS — Z1211 Encounter for screening for malignant neoplasm of colon: Secondary | ICD-10-CM

## 2014-12-20 NOTE — Progress Notes (Signed)
[name], female   DOB: [birth year], 64 y.o.   MRN: [id number] 64 y.o. G2P0002 Married  Caucasian Fe here for annual exam.  Mother passed in September, she was at [facility] and stayed with her at all times.  Recently treated for Bronchitis.  Still has a cough.  Patient's last menstrual period was 11/17/2001 (approximate).          Sexually active: Yes.    The current method of family planning is tubal ligation and post menopausal status.    Exercising: Yes.    Home exercise routine includes walking at least 5 times per week. Smoker:  no  Health Maintenance: Pap: 07/31/2011, Negative with negative HR HPV  MMG: 09/07/14 ROI for results Colonoscopy: 2007, 2 polyps and recheck in 10 years BMD: 09/07/14, T-Score 0.0 Spine / -0.9 Right Femur / -0.3 Left Femur TDaP: 07/29/2010  Shingles: 05/06/13 Labs:  PCP, appointment in two weeks.  Will have Vit D checked with PCP.   reports that she has never smoked. She has never used smokeless tobacco. She reports that she does not drink alcohol or use illicit drugs.  Past Medical History  Diagnosis Date  . Irregular menses   . Hearing loss since childhood    bilateral hearing loss  . Skin lesion of left leg 12/08    atypical    Past Surgical History  Procedure Laterality Date  . Tubal ligation Bilateral 1991  . Breast lumpectomy Left 1995    benign  . Colonoscopy  07/04/2005    recheck in 10 years - Dr Collene Mares  . Colposcopy  02/1996    benign  . Colposcopy  12/1998    benign    Current Outpatient Prescriptions  Medication Sig Dispense Refill  . acetaminophen (TYLENOL) 100 MG/ML solution Take 10 mg/kg by mouth every 4 (four) hours as needed for fever.    . cholecalciferol (VITAMIN D) 1000 UNITS tablet Take 1,000 Units by mouth daily. 2 tabs Daily    . Multiple Vitamin (MULTIVITAMIN) tablet Take 1 tablet by mouth daily.     No current facility-administered medications for this visit.    Family History  Problem Relation Age of  Onset  . Hypertension Father   . Cancer Paternal Aunt 55    colon cancer  . Diabetes Paternal Grandmother   . Hypertension Mother   . Thyroid disease Mother   . Hypertension Brother   . Breast cancer Paternal Aunt     ROS:  Pertinent items are noted in HPI.  Otherwise, a comprehensive ROS was negative.  Exam:   BP 112/72 mmHg  Pulse 76  Ht 5' 7.25" (1.708 m)  Wt 172 lb (78.019 kg)  BMI 26.74 kg/m2  LMP 11/17/2001 (Approximate) Height: 5' 7.25" (170.8 cm) Ht Readings from Last 3 Encounters:  12/20/14 5' 7.25" (1.708 m)  12/12/14 5' 7.75" (1.721 m)  02/07/14 5' 7.75" (1.721 m)    General appearance: alert, cooperative and appears stated age Head: Normocephalic, without obvious abnormality, atraumatic Neck: no adenopathy, supple, symmetrical, trachea midline and thyroid normal to inspection and palpation Lungs: clear to auscultation bilaterally Breasts: normal appearance, no masses or tenderness Heart: regular rate and rhythm Abdomen: soft, non-tender; no masses,  no organomegaly Extremities: extremities normal, atraumatic, no cyanosis or edema Skin: Skin color, texture, turgor normal. No rashes or lesions Lymph nodes: Cervical, supraclavicular, and axillary nodes normal. No abnormal inguinal nodes palpated Neurologic: Grossly normal   Pelvic: External genitalia:  no lesions  Urethra:  normal appearing urethra with no masses, tenderness or lesions              Bartholin's and Skene's: normal                 Vagina: normal appearing vagina with normal color and discharge, no lesions              Cervix: anteverted              Pap taken: Yes.   Bimanual Exam:  Uterus:  normal size, contour, position, consistency, mobility, non-tender              Adnexa: no mass, fullness, tenderness               Rectovaginal: Confirms               Anus:  normal sphincter tone, no lesions  Chaperone present: yes  A:  Well Woman with normal exam  Postmenopausal - no  HRT  Grief reaction to mothers death  Recent Bronchitis   P:   Reviewed health and wellness pertinent to exam  Pap smear as above  Mammogram is due 08/2015 - ROI for this year  She will be getting labs done at PCP  IFOB is given  Counseled on breast self exam, mammography screening, adequate intake of calcium and vitamin D, diet and exercise, Kegel's exercises return annually or prn  An After Visit Summary was printed and given to the patient.

## 2014-12-20 NOTE — Patient Instructions (Addendum)

## 2014-12-21 NOTE — Progress Notes (Signed)
Encounter reviewed by Dr. Bayla Mcgovern Amundson C. Silva.  

## 2014-12-25 LAB — IPS PAP TEST WITH HPV

## 2015-01-04 LAB — FECAL OCCULT BLOOD, IMMUNOCHEMICAL: IMMUNOLOGICAL FECAL OCCULT BLOOD TEST: NEGATIVE

## 2015-01-04 NOTE — Addendum Note (Signed)
Addended by: Susanne Greenhouse E on: 01/04/2015 11:58 AM   Modules accepted: Orders

## 2015-01-08 ENCOUNTER — Encounter: Payer: Self-pay | Admitting: Family Medicine

## 2015-01-08 ENCOUNTER — Ambulatory Visit (INDEPENDENT_AMBULATORY_CARE_PROVIDER_SITE_OTHER): Payer: BLUE CROSS/BLUE SHIELD | Admitting: Family Medicine

## 2015-01-08 VITALS — BP 136/74 | HR 72 | Temp 98.7°F | Resp 14 | Ht 67.5 in | Wt 174.0 lb

## 2015-01-08 DIAGNOSIS — Z1159 Encounter for screening for other viral diseases: Secondary | ICD-10-CM

## 2015-01-08 DIAGNOSIS — E785 Hyperlipidemia, unspecified: Secondary | ICD-10-CM | POA: Diagnosis not present

## 2015-01-08 DIAGNOSIS — E559 Vitamin D deficiency, unspecified: Secondary | ICD-10-CM | POA: Diagnosis not present

## 2015-01-08 DIAGNOSIS — Z0000 Encounter for general adult medical examination without abnormal findings: Secondary | ICD-10-CM | POA: Diagnosis not present

## 2015-01-08 DIAGNOSIS — J209 Acute bronchitis, unspecified: Secondary | ICD-10-CM

## 2015-01-08 LAB — COMPREHENSIVE METABOLIC PANEL
ALBUMIN: 4.2 g/dL (ref 3.6–5.1)
ALT: 14 U/L (ref 6–29)
AST: 22 U/L (ref 10–35)
Alkaline Phosphatase: 97 U/L (ref 33–130)
BILIRUBIN TOTAL: 0.4 mg/dL (ref 0.2–1.2)
BUN: 13 mg/dL (ref 7–25)
CALCIUM: 9.5 mg/dL (ref 8.6–10.4)
CHLORIDE: 101 mmol/L (ref 98–110)
CO2: 29 mmol/L (ref 20–31)
Creat: 0.73 mg/dL (ref 0.50–0.99)
Glucose, Bld: 81 mg/dL (ref 70–99)
Potassium: 4.3 mmol/L (ref 3.5–5.3)
Sodium: 140 mmol/L (ref 135–146)
Total Protein: 7.1 g/dL (ref 6.1–8.1)

## 2015-01-08 LAB — CBC WITH DIFFERENTIAL/PLATELET
Basophils Absolute: 0 10*3/uL (ref 0.0–0.1)
Basophils Relative: 0 % (ref 0–1)
Eosinophils Absolute: 0.1 10*3/uL (ref 0.0–0.7)
Eosinophils Relative: 2 % (ref 0–5)
HEMATOCRIT: 41 % (ref 36.0–46.0)
HEMOGLOBIN: 13.5 g/dL (ref 12.0–15.0)
LYMPHS PCT: 23 % (ref 12–46)
Lymphs Abs: 1.6 10*3/uL (ref 0.7–4.0)
MCH: 29.2 pg (ref 26.0–34.0)
MCHC: 32.9 g/dL (ref 30.0–36.0)
MCV: 88.6 fL (ref 78.0–100.0)
MONO ABS: 0.5 10*3/uL (ref 0.1–1.0)
MONOS PCT: 7 % (ref 3–12)
MPV: 10.4 fL (ref 8.6–12.4)
NEUTROS ABS: 4.6 10*3/uL (ref 1.7–7.7)
Neutrophils Relative %: 68 % (ref 43–77)
Platelets: 239 10*3/uL (ref 150–400)
RBC: 4.63 MIL/uL (ref 3.87–5.11)
RDW: 13.8 % (ref 11.5–15.5)
WBC: 6.8 10*3/uL (ref 4.0–10.5)

## 2015-01-08 LAB — LIPID PANEL
Cholesterol: 225 mg/dL — ABNORMAL HIGH (ref 125–200)
HDL: 55 mg/dL (ref >=46)
LDL CALC: 151 mg/dL — AB (ref <130)
TRIGLYCERIDES: 95 mg/dL (ref <150)
Total CHOL/HDL Ratio: 4.1 Ratio (ref <=5.0)
VLDL: 19 mg/dL (ref <30)

## 2015-01-08 MED ORDER — AZITHROMYCIN 250 MG PO TABS: ORAL_TABLET | ORAL | Status: DC

## 2015-01-08 NOTE — Assessment & Plan Note (Signed)
Mild hyperlipidemia, diet controlled

## 2015-01-08 NOTE — Progress Notes (Signed)
[name], female   DOB: [birth year], 64 y.o.   MRN: [id number]   Subjective:    [name], female    DOB: [birth year], 64 y.o.   MRN: [id number]  Patient presents for F/U  Pt here for annual visit, recent had GYN exam, reviewed note. Prevention including colonoscopy,mammo, bone density, PAP , immunizations UTD. Due for fasting labs and screening hep C   Treated for bronchitis 1 month ago, improved but 2 weeks ago, symptoms returned, productive cough, worse in AM, no fever, no SOB, +sinus drainage mostly post nasal. No current meds being taken.  Note mother passed away a few months ago - was in hospice    Review Of Systems:  GEN- denies fatigue, fever, weight loss,weakness, recent illness HEENT- denies eye drainage, change in vision, +nasal discharge, CVS- denies chest pain, palpitations RESP- denies SOB, +cough, wheeze ABD- denies N/V, change in stools, abd pain GU- denies dysuria, hematuria, dribbling, incontinence MSK- denies joint pain, muscle aches, injury Neuro- denies headache, dizziness, syncope, seizure activity       Objective:    BP 136/74 mmHg  Pulse 72  Temp(Src) 98.7 F (37.1 C) (Oral)  Resp 14  Ht 5' 7.5" (1.715 m)  Wt 174 lb (78.926 kg)  BMI 26.83 kg/m2  LMP 11/17/2001 (Approximate) GEN- NAD, alert and oriented x3 HEENT- PERRL, EOMI, non injected sclera, pink conjunctiva, MMM, oropharynx clear, TM clear bilat, wears hearing aids, enlarged turbinates, +rhinorrhea Neck- Supple, no LAD, no thyromegaly CVS- RRR, no murmur RESP- mild upper airway congestion,no rales, no wheeze, normal WOB ABD-NABS,soft,NT,ND EXT- No edema Pulses- Radial, DP- 2+        Assessment & Plan:      Problem List Items Addressed This Visit    Vitamin D deficiency   Relevant Orders   Vitamin D, 25-hydroxy   Hyperlipidemia - Primary    Mild hyperlipidemia, diet controlled      Relevant Orders   Lipid panel    Other Visit Diagnoses    Routine general medical examination at a health care facility        CPE done, GYN has performed PAP Smear, Mamm and Bone Density UTD, Fasting labs immunizations UTD    Relevant Orders    CBC with Differential/Platelet    Comprehensive metabolic panel    Need for hepatitis C screening test        Relevant Orders    Hepatitis C antibody, reflex    Acute bronchitis, unspecified organism        2nd illness with some sinus congestion, will treat with zpak, nasal saline, has OTC mucinex and cough syrup prescribed last month       Note: This dictation was prepared with Dragon dictation along with smaller phrase technology. Any transcriptional errors that result from this process are unintentional.

## 2015-01-08 NOTE — Patient Instructions (Signed)
Continue current supplements We will call with lab results F/U 1 year or as needed

## 2015-01-09 LAB — VITAMIN D 25 HYDROXY (VIT D DEFICIENCY, FRACTURES): Vit D, 25-Hydroxy: 27 ng/mL — ABNORMAL LOW (ref 30–100)

## 2015-01-09 LAB — HEPATITIS C ANTIBODY: HCV Ab: NEGATIVE

## 2015-04-13 ENCOUNTER — Ambulatory Visit: Payer: BLUE CROSS/BLUE SHIELD | Admitting: Family Medicine

## 2015-07-19 ENCOUNTER — Encounter: Payer: Self-pay | Admitting: *Deleted

## 2015-08-31 LAB — HM MAMMOGRAPHY

## 2015-09-20 ENCOUNTER — Encounter: Payer: Self-pay | Admitting: Family Medicine

## 2015-12-05 ENCOUNTER — Telehealth: Payer: Self-pay | Admitting: Nurse Practitioner

## 2015-12-05 NOTE — Telephone Encounter (Signed)
Left patient a message to call back to reschedule a future appointment that was cancelled by the provider. °

## 2015-12-06 ENCOUNTER — Ambulatory Visit (INDEPENDENT_AMBULATORY_CARE_PROVIDER_SITE_OTHER): Payer: BLUE CROSS/BLUE SHIELD | Admitting: *Deleted

## 2015-12-06 DIAGNOSIS — Z23 Encounter for immunization: Secondary | ICD-10-CM | POA: Diagnosis not present

## 2015-12-06 NOTE — Progress Notes (Signed)
[name], female   DOB: 1950/12/17, 65 y.o.   MRN: [id number]   Patient seen in office for Influenza Vaccination.   Tolerated IM administration well.   Immunization history updated.

## 2015-12-11 ENCOUNTER — Telehealth: Payer: Self-pay | Admitting: Nurse Practitioner

## 2015-12-11 NOTE — Telephone Encounter (Signed)
Left message to call and reschedule cancelled appointment.

## 2015-12-25 ENCOUNTER — Ambulatory Visit: Payer: BLUE CROSS/BLUE SHIELD | Admitting: Nurse Practitioner

## 2016-01-02 ENCOUNTER — Ambulatory Visit: Payer: BLUE CROSS/BLUE SHIELD | Admitting: Nurse Practitioner

## 2016-01-09 ENCOUNTER — Ambulatory Visit (INDEPENDENT_AMBULATORY_CARE_PROVIDER_SITE_OTHER): Payer: BLUE CROSS/BLUE SHIELD | Admitting: Family Medicine

## 2016-01-09 ENCOUNTER — Encounter: Payer: Self-pay | Admitting: Family Medicine

## 2016-01-09 VITALS — BP 132/82 | HR 76 | Temp 98.0°F | Resp 18 | Ht 68.0 in | Wt 175.0 lb

## 2016-01-09 DIAGNOSIS — Z0000 Encounter for general adult medical examination without abnormal findings: Secondary | ICD-10-CM | POA: Diagnosis not present

## 2016-01-09 DIAGNOSIS — B9789 Other viral agents as the cause of diseases classified elsewhere: Secondary | ICD-10-CM

## 2016-01-09 DIAGNOSIS — E559 Vitamin D deficiency, unspecified: Secondary | ICD-10-CM

## 2016-01-09 DIAGNOSIS — E7800 Pure hypercholesterolemia, unspecified: Secondary | ICD-10-CM

## 2016-01-09 DIAGNOSIS — J069 Acute upper respiratory infection, unspecified: Secondary | ICD-10-CM | POA: Diagnosis not present

## 2016-01-09 LAB — LIPID PANEL
CHOLESTEROL: 231 mg/dL — AB (ref <200)
HDL: 55 mg/dL (ref >50)
LDL Cholesterol: 154 mg/dL — ABNORMAL HIGH (ref <100)
TRIGLYCERIDES: 109 mg/dL (ref <150)
Total CHOL/HDL Ratio: 4.2 Ratio (ref <5.0)
VLDL: 22 mg/dL (ref <30)

## 2016-01-09 LAB — CBC WITH DIFFERENTIAL/PLATELET
BASOS ABS: 55 {cells}/uL (ref 0–200)
Basophils Relative: 1 %
EOS PCT: 1 %
Eosinophils Absolute: 55 cells/uL (ref 15–500)
HCT: 39 % (ref 35.0–45.0)
HEMOGLOBIN: 12.7 g/dL (ref 12.0–15.0)
LYMPHS ABS: 1375 {cells}/uL (ref 850–3900)
Lymphocytes Relative: 25 %
MCH: 29.2 pg (ref 27.0–33.0)
MCHC: 32.6 g/dL (ref 32.0–36.0)
MCV: 89.7 fL (ref 80.0–100.0)
MONO ABS: 385 {cells}/uL (ref 200–950)
MPV: 9.9 fL (ref 7.5–12.5)
Monocytes Relative: 7 %
NEUTROS ABS: 3630 {cells}/uL (ref 1500–7800)
Neutrophils Relative %: 66 %
Platelets: 263 10*3/uL (ref 140–400)
RBC: 4.35 MIL/uL (ref 3.80–5.10)
RDW: 13.8 % (ref 11.0–15.0)
WBC: 5.5 10*3/uL (ref 3.8–10.8)

## 2016-01-09 LAB — TSH: TSH: 2.73 mIU/L

## 2016-01-09 LAB — COMPREHENSIVE METABOLIC PANEL
ALBUMIN: 4.2 g/dL (ref 3.6–5.1)
ALT: 13 U/L (ref 6–29)
AST: 26 U/L (ref 10–35)
Alkaline Phosphatase: 91 U/L (ref 33–130)
BILIRUBIN TOTAL: 0.4 mg/dL (ref 0.2–1.2)
BUN: 13 mg/dL (ref 7–25)
CHLORIDE: 105 mmol/L (ref 98–110)
CO2: 27 mmol/L (ref 20–31)
CREATININE: 0.84 mg/dL (ref 0.50–0.99)
Calcium: 9.3 mg/dL (ref 8.6–10.4)
Glucose, Bld: 87 mg/dL (ref 70–99)
Potassium: 4.4 mmol/L (ref 3.5–5.3)
SODIUM: 140 mmol/L (ref 135–146)
TOTAL PROTEIN: 7 g/dL (ref 6.1–8.1)

## 2016-01-09 MED ORDER — HYDROCODONE-HOMATROPINE 5-1.5 MG/5ML PO SYRP
5.0000 mL | ORAL_SOLUTION | Freq: Three times a day (TID) | ORAL | 0 refills | Status: DC | PRN
Start: 2016-01-09 — End: 2016-02-19

## 2016-01-09 NOTE — Progress Notes (Signed)
   Subjective:    [name], female    DOB: 30-Jul-1950, 65 y.o.   MRN: [id number]  Patient presents for Annual Exam   Pt here for CPE, followed by GYN- next appt in Jan  Mammo- UTD Colon cancer screening- Dr. Collene Mares Had May 2017, had polyps , repeat in 5 years  Bone Density- UTD and normal  Immuniations- UTD  He is followed by orthopedics at [facility] she had SI joint dysfunction they gave her anti-inflammatory diclofenac for about 2 months she is now taking tumor it naturally and doing home exercises. She did not have any injections yet or formal physical therapy. She feels like she is about 85% better but does not want to proceed with anything further at this time   Meds and history reviewed   Cough congestion for past 3 weeks, no fever, now just has residual cough, keeps her up, non productive, would like refill on hydcodan  She does monitor her blood pressure at home it is range from 119 to 140's/ 70-80 typically the blood pressures less than 120 Review Of Systems:  GEN- denies fatigue, fever, weight loss,weakness, recent illness HEENT- denies eye drainage, change in vision, nasal discharge, CVS- denies chest pain, palpitations RESP- denies SOB, +cough, wheeze ABD- denies N/V, change in stools, abd pain GU- denies dysuria, hematuria, dribbling, incontinence MSK- denies joint pain, muscle aches, injury Neuro- denies headache, dizziness, syncope, seizure activity       Objective:    BP 132/82   Pulse 76   Temp 98 F (36.7 C)   Resp 18   Ht 5\' 8"  (1.727 m)   Wt 175 lb (79.4 kg)   LMP 11/17/2001 (Approximate)   BMI 26.61 kg/m  GEN- NAD, alert and oriented x3 HEENT- PERRL, EOMI, non injected sclera, pink conjunctiva, MMM, oropharynx clear, TM clear  , Hearing aids, TM clear canals clear, nares clear  Neck- Supple, no thyromegaly, no bruit, no LAD  CVS- RRR, no murmur RESP-CTAB ABD-NABS,soft,NT,ND EXT- No edema Pulses- Radial, DP- 2+         Assessment & Plan:      Problem List Items Addressed This Visit    Vitamin D deficiency   Relevant Orders   Vitamin D, 25-hydroxy   Hyperlipidemia    Other Visit Diagnoses    Routine general medical examination at a health care facility    -  Primary   CPE done, prevention UTD, GYN appt in Jan, Immunization UTD, Fasting labs today    Relevant Orders   CBC with Differential/Platelet   Comprehensive metabolic panel   Lipid panel   TSH   Viral URI       Now with residualcough, hycodan given, no red flags       Note: This dictation was prepared with Dragon dictation along with smaller phrase technology. Any transcriptional errors that result from this process are unintentional.

## 2016-01-09 NOTE — Patient Instructions (Signed)
I recommend eye visit once a year I recommend dental visit every 6 months Goal is to  Exercise 30 minutes 5 days a week We will send a letter with lab results  F/U 1 year for Physical   

## 2016-01-10 LAB — VITAMIN D 25 HYDROXY (VIT D DEFICIENCY, FRACTURES): Vit D, 25-Hydroxy: 38 ng/mL (ref 30–100)

## 2016-01-21 ENCOUNTER — Other Ambulatory Visit: Payer: Self-pay | Admitting: *Deleted

## 2016-01-21 DIAGNOSIS — E785 Hyperlipidemia, unspecified: Secondary | ICD-10-CM

## 2016-02-05 ENCOUNTER — Ambulatory Visit: Payer: BLUE CROSS/BLUE SHIELD | Admitting: Nurse Practitioner

## 2016-02-19 ENCOUNTER — Ambulatory Visit (INDEPENDENT_AMBULATORY_CARE_PROVIDER_SITE_OTHER): Payer: BLUE CROSS/BLUE SHIELD | Admitting: Nurse Practitioner

## 2016-02-19 ENCOUNTER — Encounter: Payer: Self-pay | Admitting: Nurse Practitioner

## 2016-02-19 VITALS — BP 134/80 | HR 68 | Ht 67.75 in | Wt 178.0 lb

## 2016-02-19 DIAGNOSIS — Z01419 Encounter for gynecological examination (general) (routine) without abnormal findings: Secondary | ICD-10-CM | POA: Diagnosis not present

## 2016-02-19 DIAGNOSIS — Z1211 Encounter for screening for malignant neoplasm of colon: Secondary | ICD-10-CM | POA: Diagnosis not present

## 2016-02-19 DIAGNOSIS — Z0000 Encounter for general adult medical examination without abnormal findings: Secondary | ICD-10-CM

## 2016-02-19 NOTE — Patient Instructions (Signed)

## 2016-02-19 NOTE — Progress Notes (Signed)
[name], female   DOB: Jan 25, 1951, 66 y.o.   MRN: [id number]  66 y.o. G78P2002 Married  Caucasian Fe here for annual exam. 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  On NSAID's initially and now Tumeric.   Patient's last menstrual period was 11/17/2001 (approximate).          Sexually active: Yes.    The current method of family planning is post menopausal status and bilateral tubal ligation.    Exercising: Yes.    walking, ping pong and gardening Smoker:  no  Health Maintenance: Pap: 12/20/14, Negative with negative HR HPV  MMG: 08/31/15, 3D, Bi-Rads 2: Benign Colonoscopy: 07/04/15, adenoma polyp, repeat in 5 years BMD: 09/07/14, T-Score 0.0 Spine / -0.9 Right Femur / -0.3 Left Femur TDaP: 07/29/2010  Shingles: 09/19/11 Hep C: 01/08/15 HIV: Declined previously Labs: PCP takes care of labs   reports that she has never smoked. She has never used smokeless tobacco. She reports that she does not drink alcohol or use drugs.  Past Medical History:  Diagnosis Date  . Hearing loss since childhood   bilateral hearing loss  . Irregular menses   . Skin lesion of left leg 12/08   atypical    Past Surgical History:  Procedure Laterality Date  . BREAST LUMPECTOMY Left 1995   benign  . COLONOSCOPY  07/04/2005   recheck in 10 years - Dr Collene Mares  . COLPOSCOPY  02/1996   benign  . COLPOSCOPY  12/1998   benign  . TUBAL LIGATION Bilateral 1991    Current Outpatient Prescriptions  Medication Sig Dispense Refill  . acetaminophen (TYLENOL) 100 MG/ML solution Take 10 mg/kg by mouth every 4 (four) hours as needed for fever.    . cholecalciferol (VITAMIN D) 1000 UNITS tablet Take 1,000 Units by mouth daily. 2 tabs Daily    . Multiple Vitamin (MULTIVITAMIN) tablet Take 1 tablet by mouth daily.    . TURMERIC PO Take 800 mg by mouth daily.     No current facility-administered medications for this visit.     Family History  Problem Relation Age of Onset  . Hypertension Father   .  Diabetes Paternal Grandmother   . Hypertension Mother   . Thyroid disease Mother   . Hypertension Brother   . Cancer Paternal Aunt 56    colon cancer  . Breast cancer Paternal Aunt     ROS:  Pertinent items are noted in HPI.  Otherwise, a comprehensive ROS was negative.  Exam:   BP 134/80 (BP Location: Right Arm, Patient Position: Sitting, Cuff Size: Normal)   Pulse 68   Ht 5' 7.75" (1.721 m)   Wt 178 lb (80.7 kg)   LMP 11/17/2001 (Approximate)   BMI 27.26 kg/m  Height: 5' 7.75" (172.1 cm) Ht Readings from Last 3 Encounters:  02/19/16 5' 7.75" (1.721 m)  01/09/16 5\' 8"  (1.727 m)  01/08/15 5' 7.5" (1.715 m)    General appearance: alert, cooperative and appears stated age Head: Normocephalic, without obvious abnormality, atraumatic Neck: no adenopathy, supple, symmetrical, trachea midline and thyroid normal to inspection and palpation Lungs: clear to auscultation bilaterally Breasts: normal appearance, no masses or tenderness Heart: regular rate and rhythm Abdomen: soft, non-tender; no masses,  no organomegaly Extremities: extremities normal, atraumatic, no cyanosis or edema Skin: Skin color, texture, turgor normal. No rashes or lesions Lymph nodes: Cervical, supraclavicular, and axillary nodes normal. No abnormal inguinal nodes palpated Neurologic: Grossly normal   Pelvic: External genitalia:  no lesions  Urethra:  normal appearing urethra with no masses, tenderness or lesions              Bartholin's and Skene's: normal                 Vagina: normal appearing vagina with normal color and discharge, no lesions              Cervix: anteverted              Pap taken: No. Bimanual Exam:  Uterus:  normal size, contour, position, consistency, mobility, non-tender              Adnexa: no mass, fullness, tenderness               Rectovaginal: Confirms               Anus:  normal sphincter tone, no lesions  Chaperone present: yes  A:  Well Woman with normal  exam   Postmenopausal - no HRT             Grief reaction to mothers death is better this year  History of Vit D deficiency  History of elevated cholesterol            P:   Reviewed health and wellness pertinent to exam  Pap smear as above  Mammogram is due 08/2016  Counseled on breast self exam, mammography screening, adequate intake of calcium and vitamin D, diet and exercise, Kegel's exercises return annually or prn  An After Visit Summary was printed and given to the patient.

## 2016-06-07 ENCOUNTER — Ambulatory Visit (HOSPITAL_COMMUNITY)
Admission: EM | Admit: 2016-06-07 | Discharge: 2016-06-07 | Disposition: A | Discharge status: Home / Self Care | Payer: BLUE CROSS/BLUE SHIELD | Attending: Family Medicine | Admitting: Family Medicine

## 2016-06-07 ENCOUNTER — Encounter (HOSPITAL_COMMUNITY): Payer: Self-pay | Admitting: Emergency Medicine

## 2016-06-07 DIAGNOSIS — R591 Generalized enlarged lymph nodes: Secondary | ICD-10-CM | POA: Diagnosis not present

## 2016-06-07 LAB — CBC WITH DIFFERENTIAL/PLATELET
BASOS PCT: 1 %
Basophils Absolute: 0 10*3/uL (ref 0.0–0.1)
EOS ABS: 0.1 10*3/uL (ref 0.0–0.7)
EOS PCT: 1 %
HCT: 40.8 % (ref 36.0–46.0)
HEMOGLOBIN: 12.8 g/dL (ref 12.0–15.0)
LYMPHS ABS: 1.5 10*3/uL (ref 0.7–4.0)
Lymphocytes Relative: 27 %
MCH: 28.3 pg (ref 26.0–34.0)
MCHC: 31.4 g/dL (ref 30.0–36.0)
MCV: 90.3 fL (ref 78.0–100.0)
MONO ABS: 0.3 10*3/uL (ref 0.1–1.0)
MONOS PCT: 6 %
Neutro Abs: 3.6 10*3/uL (ref 1.7–7.7)
Neutrophils Relative %: 65 %
Platelets: 206 10*3/uL (ref 150–400)
RBC: 4.52 MIL/uL (ref 3.87–5.11)
RDW: 13.2 % (ref 11.5–15.5)
WBC: 5.5 10*3/uL (ref 4.0–10.5)

## 2016-06-07 LAB — SEDIMENTATION RATE: Sed Rate: 12 mm/hr (ref 0–22)

## 2016-06-07 NOTE — ED Provider Notes (Signed)
CSN: [id number]     Arrival date & time 06/07/16  1206 History   First MD Initiated Contact with Patient 06/07/16 1302     Chief Complaint  Patient presents with  . lumps   (Consider location/radiation/quality/duration/timing/severity/associated sxs/prior Treatment) Patient is a healthy 66 year old female, is here today because she noticed a small round solid lump on her posterior neck 4 days ago, and since last night she has felt more bumps on her scalp. She describes these lumps to be sore to the touch. She denies recent sickness. She denies fever.  She denies recent changes in her life. She does have an appointment with her PCP to be seem on Monday.         Past Medical History:  Diagnosis Date  . Hearing loss since childhood   bilateral hearing loss  . Irregular menses   . Skin lesion of left leg 12/08   atypical   Past Surgical History:  Procedure Laterality Date  . BREAST LUMPECTOMY Left 1995   benign  . COLONOSCOPY  07/04/2005   recheck in 10 years - Dr Collene Mares  . COLPOSCOPY  02/1996   benign  . COLPOSCOPY  12/1998   benign  . TUBAL LIGATION Bilateral 1991   Family History  Problem Relation Age of Onset  . Hypertension Father   . Diabetes Paternal Grandmother   . Hypertension Mother   . Thyroid disease Mother   . Hypertension Brother   . Cancer Paternal Aunt 66    colon cancer  . Breast cancer Paternal Aunt    Social History  Substance Use Topics  . Smoking status: Never Smoker  . Smokeless tobacco: Never Used  . Alcohol use No   OB History    Gravida Para Term Preterm AB Living   2 2 2  0 0 2   SAB TAB Ectopic Multiple Live Births   0 0 0 0 2     Review of Systems  Constitutional:       As stated in the HPI    Allergies  Patient has no known allergies.  Home Medications   Prior to Admission medications   Medication Sig Start Date End Date Taking? Authorizing Provider  acetaminophen (TYLENOL) 100 MG/ML solution Take 10 mg/kg by mouth every 4  (four) hours as needed for fever.    Historical Provider, MD  cholecalciferol (VITAMIN D) 1000 UNITS tablet Take 1,000 Units by mouth daily. 2 tabs Daily    Historical Provider, MD  Multiple Vitamin (MULTIVITAMIN) tablet Take 1 tablet by mouth daily.    Historical Provider, MD  TURMERIC PO Take 800 mg by mouth daily.    Historical Provider, MD   Meds Ordered and Administered this Visit  Medications - No data to display  BP (!) 149/73 (BP Location: Left Arm)   Pulse 72   Temp 98.5 F (36.9 C) (Oral)   LMP 11/17/2001 (Approximate)   SpO2 100%  No data found.   Physical Exam  Constitutional: She is oriented to person, place, and time. She appears well-developed and well-nourished.  HENT:  Head: Normocephalic and atraumatic.  Right Ear: External ear normal.  Left Ear: External ear normal.  TM pearly gray with no erythema   Cardiovascular: Normal rate, regular rhythm and normal heart sounds.   Pulmonary/Chest: Effort normal and breath sounds normal. No respiratory distress. She has no wheezes.  Neurological: She is alert and oriented to person, place, and time.  Skin:  Noticed to have a quarter size solid  non-movable mass on the right posterior neck; most likely lymph node.   Noticed to have 3 other smaller (approximately 77mm) bumps on her scalp, scattered unilaterally on the right side.   Nursing note and vitals reviewed.   Urgent Care Course     Procedures (including critical care time)  Labs Review Labs Reviewed  CBC WITH DIFFERENTIAL/PLATELET  SEDIMENTATION RATE    Imaging Review No results found.  MDM   1. Lymphadenopathy    Most likely a beginning of a viral illness. Please apply heat to the swollen area. Please follow up with your primary care doctor on Monday as scheduled. CBC and Sed Rate obtained and will call patient with lab results.   Collaborating physician consulted for this visit.    Barry Dienes, NP 06/07/16 1323

## 2016-06-07 NOTE — Discharge Instructions (Signed)
Apply heat to the area. We will call you tonight or tomorrow to inform you of the lab result.   See your primary care doctor on Monday as scheduled.   This is most likely beginning of a viral illness.

## 2016-06-07 NOTE — ED Triage Notes (Signed)
Pt noticed a lump on the back of her head on the right side on Thursday.  She has an appointment to see her PCP on Monday, but she has discovered several more lumps on her neck and head since then.  Pt denies any fever but has a slight headache on the right posterior side of her head.

## 2016-06-09 ENCOUNTER — Encounter: Payer: Self-pay | Admitting: Family Medicine

## 2016-06-09 ENCOUNTER — Ambulatory Visit (INDEPENDENT_AMBULATORY_CARE_PROVIDER_SITE_OTHER): Payer: BLUE CROSS/BLUE SHIELD | Admitting: Family Medicine

## 2016-06-09 VITALS — BP 134/78 | HR 82 | Temp 98.5°F | Resp 14 | Ht 67.75 in | Wt 180.0 lb

## 2016-06-09 DIAGNOSIS — R590 Localized enlarged lymph nodes: Secondary | ICD-10-CM | POA: Diagnosis not present

## 2016-06-09 DIAGNOSIS — L729 Follicular cyst of the skin and subcutaneous tissue, unspecified: Secondary | ICD-10-CM | POA: Diagnosis not present

## 2016-06-09 MED ORDER — AMOXICILLIN-POT CLAVULANATE 875-125 MG PO TABS: 1.0000 | ORAL_TABLET | Freq: Two times a day (BID) | ORAL | 0 refills | Status: DC

## 2016-06-09 NOTE — Progress Notes (Signed)
Subjective:    [name], female    DOB: 09/03/1950, 66 y.o.   MRN: [id number]  Patient presents for Sore Knot to Neck (went to UC on Saturday- has swollen lymp node in neck and swmall sore spot behind ear and (2) spots on skull)  Here with swollen lymph node on her right side but also a few scalp lesions. She initially noted the lymph node on Thursday she did not have any fever no chills no congestion. She was at urgent care on Saturday she was worried they thought that this is possible parvo viral lesion. She still has not present with any other symptoms but has another spot behind her right ear. She denies any tick bites scratch no abrasions that she is aware of on her scalp. Normal ESR and CBC    Review Of Systems:  GEN- denies fatigue, fever, weight loss,weakness, recent illness HEENT- denies eye drainage, change in vision, nasal discharge, CVS- denies chest pain, palpitations RESP- denies SOB, cough, wheeze ABD- denies N/V, change in stools, abd pain GU- denies dysuria, hematuria, dribbling, incontinence MSK- denies joint pain, muscle aches, injury Neuro- denies headache, dizziness, syncope, seizure activity       Objective:    BP 134/78   Pulse 82   Temp 98.5 F (36.9 C) (Oral)   Resp 14   Ht 5' 7.75" (1.721 m)   Wt 180 lb (81.6 kg)   LMP 11/17/2001 (Approximate)   SpO2 98%   BMI 27.57 kg/m  GEN- NAD, alert and oriented x3 HEENT- PERRL, EOMI, non injected sclera, pink conjunctiva, MMM, oropharynx clear, tm CLEAR no effusion canal clear, small old seb keratosis in ear Neck- Supple, + Large right ant cervical node, Scalp- right parietal region 2 small erythematous cystic lesions pea size, small occiptal slightly smaller than pea nodule TTP, no erythema, erythematous papule behind right ear       Assessment & Plan:      Problem List Items Addressed This Visit    None    Visit Diagnoses    Cervical lymphadenopathy    -  Primary   Reactive  lymph node, likely from the scalp, though unclear port or entry, trial of antibiotic augmentin recheck in Friday, if no improvement would image node on Right side due to size    Scalp cyst          Note: This dictation was prepared with Dragon dictation along with smaller phrase technology. Any transcriptional errors that result from this process are unintentional.    

## 2016-06-09 NOTE — Patient Instructions (Signed)
F/U Friday for recheck 

## 2016-06-13 ENCOUNTER — Ambulatory Visit (INDEPENDENT_AMBULATORY_CARE_PROVIDER_SITE_OTHER): Payer: BLUE CROSS/BLUE SHIELD | Admitting: Family Medicine

## 2016-06-13 ENCOUNTER — Encounter: Payer: Self-pay | Admitting: Family Medicine

## 2016-06-13 VITALS — BP 122/72 | HR 78 | Temp 98.5°F | Resp 16 | Ht 67.5 in | Wt 177.0 lb

## 2016-06-13 DIAGNOSIS — R590 Localized enlarged lymph nodes: Secondary | ICD-10-CM

## 2016-06-13 DIAGNOSIS — L729 Follicular cyst of the skin and subcutaneous tissue, unspecified: Secondary | ICD-10-CM

## 2016-06-13 MED ORDER — AMOXICILLIN-POT CLAVULANATE 875-125 MG PO TABS: 1.0000 | ORAL_TABLET | Freq: Two times a day (BID) | ORAL | 0 refills | Status: DC

## 2016-06-13 NOTE — Progress Notes (Signed)
   Subjective:    [name], female    DOB: [birth year], 66 y.o.   MRN: [id number]  Patient presents for Follow-up (reports that lymph node is improving, but areas to scalp remain tender) Patient for follow-up on her cervical lymphadenopathy she also had at the scalp cysts [place] lesions that were noted. I started her on Augmentin she will complete this in 3 days. She thinks all of the spots are smaller, scalp feels likethey dried up Her Lymph node is smaller and no longer any pain when turning her head     Review Of Systems:  GEN- denies fatigue, fever, weight loss,weakness, recent illness HEENT- denies eye drainage, change in vision, nasal discharge, CVS- denies chest pain, palpitations RESP- denies SOB, cough, wheeze ABD- denies N/V, change in stools, abd pain Neuro- denies headache, dizziness, syncope, seizure activity       Objective:    BP 122/72   Pulse 78   Temp 98.5 F (36.9 C) (Oral)   Resp 16   Ht 5' 7.5" (1.715 m)   Wt 177 lb (80.3 kg)   LMP 11/17/2001 (Approximate)   SpO2 98%   BMI 27.31 kg/m  GEN- NAD, alert and oriented x3 Neck- Supple, +decrease in right  ant cervical node < 50%  Scalp- right parietal region 2 small erythematous lesions drying up , small occiptal slightly smaller than pea nodule NT no erythema, post ear decreased papule size          Assessment & Plan:      Problem List Items Addressed This Visit    None    Visit Diagnoses    LAD (lymphadenopathy), cervical    -  Primary   Lesions improved and lymph node has decreased indicating bacterial superinfection, will extend for total of 10 days of antibiotics. If this does reoccur needs imaging   Scalp cyst          Note: This dictation was prepared with Dragon dictation along with smaller phrase technology. Any transcriptional errors that result from this process are unintentional.

## 2016-06-13 NOTE — Patient Instructions (Signed)
F/U as needed

## 2016-09-01 DIAGNOSIS — Z1231 Encounter for screening mammogram for malignant neoplasm of breast: Secondary | ICD-10-CM | POA: Diagnosis not present

## 2016-09-01 LAB — HM MAMMOGRAPHY

## 2016-09-04 ENCOUNTER — Encounter: Payer: Self-pay | Admitting: *Deleted

## 2016-10-23 ENCOUNTER — Ambulatory Visit (INDEPENDENT_AMBULATORY_CARE_PROVIDER_SITE_OTHER): Payer: Medicare HMO | Admitting: Physician Assistant

## 2016-10-23 ENCOUNTER — Encounter: Payer: Self-pay | Admitting: Physician Assistant

## 2016-10-23 VITALS — BP 110/68 | HR 76 | Temp 98.2°F | Resp 16 | Ht 67.5 in | Wt 177.0 lb

## 2016-10-23 DIAGNOSIS — M67441 Ganglion, right hand: Secondary | ICD-10-CM | POA: Diagnosis not present

## 2016-10-23 NOTE — Progress Notes (Signed)
    [name] MRN: [id number], DOB: Jun 18, 1950, 66 y.o. Date of Encounter: 10/23/2016, 12:15 PM    Chief Complaint:  Chief Complaint  Patient presents with  . Growth on right hand     HPI: 66 y.o. year old female presents with above.   She reports that she first noticed this spot on her hand in May.  States that the size has gotten slightly larger now compared to what it was in May.  Reports that the site really is not painful and is not decreasing ROM.  Says that she mostly notices it when she is holding a broom, using a hoe, or a nozzle. She is right handed.     Home Meds:   Outpatient Medications Prior to Visit  Medication Sig Dispense Refill  . acetaminophen (TYLENOL) 100 MG/ML solution Take 10 mg/kg by mouth every 4 (four) hours as needed for fever.    . cholecalciferol (VITAMIN D) 1000 UNITS tablet Take 1,000 Units by mouth daily. 2 tabs Daily    . Multiple Vitamin (MULTIVITAMIN) tablet Take 1 tablet by mouth daily.    . TURMERIC PO Take 800 mg by mouth daily.    Marland Kitchen amoxicillin-clavulanate (AUGMENTIN) 875-125 MG tablet Take 1 tablet by mouth 2 (two) times daily. 6 tablet 0   No facility-administered medications prior to visit.     Allergies: No Known Allergies    Review of Systems: See HPI for pertinent ROS. All other ROS negative.    Physical Exam: Blood pressure 110/68, pulse 76, temperature 98.2 F (36.8 C), temperature source Oral, resp. rate 16, height 5' 7.5" (1.715 m), weight 177 lb (80.3 kg), last menstrual period 11/17/2001, SpO2 97 %., Body mass index is 27.31 kg/m. General:  Appears in no acute distress. Neck: Supple. No thyromegaly. No lymphadenopathy. Lungs: Clear bilaterally to auscultation without wheezes, rales, or rhonchi. Breathing is unlabored. Heart: Regular rhythm. No murmurs, rubs, or gallops. Msk:  Right Hand----Plantar Surface---Area b/t thumb and index finger---there is   ~ 21mm round cyst under the skin. There is no  erythema.  Strength and tone normal for age. Extremities/Skin: Warm and dry.  Neuro: Alert and oriented X 3. Moves all extremities spontaneously. Gait is normal. CNII-XII grossly in tact. Psych:  Responds to questions appropriately with a normal affect.     ASSESSMENT AND PLAN:  66 y.o. year old female with  1. Ganglion cyst of flexor tendon sheath of finger of right hand Discussed that this will require treatment from a hand specialist. She voices understanding and agrees to follow-up with hand specialist. - Ambulatory referral to Orthopedic Surgery   Signed, Olean Ree Martin, [place], [facility] 10/23/2016 12:15 PM

## 2016-11-03 DIAGNOSIS — R229 Localized swelling, mass and lump, unspecified: Secondary | ICD-10-CM | POA: Diagnosis not present

## 2016-11-03 DIAGNOSIS — R2231 Localized swelling, mass and lump, right upper limb: Secondary | ICD-10-CM | POA: Diagnosis not present

## 2016-11-03 DIAGNOSIS — IMO0002 Reserved for concepts with insufficient information to code with codable children: Secondary | ICD-10-CM | POA: Insufficient documentation

## 2016-11-04 ENCOUNTER — Other Ambulatory Visit: Payer: Self-pay | Admitting: Orthopedic Surgery

## 2016-11-04 DIAGNOSIS — IMO0002 Reserved for concepts with insufficient information to code with codable children: Secondary | ICD-10-CM

## 2016-11-04 DIAGNOSIS — R229 Localized swelling, mass and lump, unspecified: Principal | ICD-10-CM

## 2016-11-06 ENCOUNTER — Ambulatory Visit
Admission: RE | Admit: 2016-11-06 | Discharge: 2016-11-06 | Disposition: A | Discharge status: Home / Self Care | Payer: Medicare HMO | Source: Ambulatory Visit | Attending: Orthopedic Surgery | Admitting: Orthopedic Surgery

## 2016-11-06 DIAGNOSIS — R229 Localized swelling, mass and lump, unspecified: Principal | ICD-10-CM

## 2016-11-06 DIAGNOSIS — R2231 Localized swelling, mass and lump, right upper limb: Secondary | ICD-10-CM | POA: Diagnosis not present

## 2016-11-06 DIAGNOSIS — IMO0002 Reserved for concepts with insufficient information to code with codable children: Secondary | ICD-10-CM

## 2016-11-11 ENCOUNTER — Ambulatory Visit (INDEPENDENT_AMBULATORY_CARE_PROVIDER_SITE_OTHER): Payer: Medicare HMO

## 2016-11-11 DIAGNOSIS — Z23 Encounter for immunization: Secondary | ICD-10-CM

## 2016-11-11 NOTE — Progress Notes (Signed)
Patient was seen in office for flu vaccine. Patient received vaccine in left deltoid. patient tolerated well

## 2016-11-12 DIAGNOSIS — R229 Localized swelling, mass and lump, unspecified: Secondary | ICD-10-CM | POA: Diagnosis not present

## 2016-11-13 ENCOUNTER — Other Ambulatory Visit: Payer: Self-pay | Admitting: Orthopedic Surgery

## 2016-11-13 ENCOUNTER — Encounter (HOSPITAL_BASED_OUTPATIENT_CLINIC_OR_DEPARTMENT_OTHER): Payer: Self-pay | Admitting: *Deleted

## 2016-11-18 ENCOUNTER — Encounter (HOSPITAL_BASED_OUTPATIENT_CLINIC_OR_DEPARTMENT_OTHER)
Admission: RE | Disposition: A | Discharge status: Home / Self Care | Payer: Self-pay | Source: Ambulatory Visit | Attending: Orthopedic Surgery

## 2016-11-18 ENCOUNTER — Encounter (HOSPITAL_BASED_OUTPATIENT_CLINIC_OR_DEPARTMENT_OTHER): Payer: Self-pay | Admitting: *Deleted

## 2016-11-18 ENCOUNTER — Ambulatory Visit (HOSPITAL_BASED_OUTPATIENT_CLINIC_OR_DEPARTMENT_OTHER): Payer: Medicare HMO | Admitting: Anesthesiology

## 2016-11-18 ENCOUNTER — Ambulatory Visit (HOSPITAL_BASED_OUTPATIENT_CLINIC_OR_DEPARTMENT_OTHER)
Admission: RE | Admit: 2016-11-18 | Discharge: 2016-11-18 | Disposition: A | Discharge status: Home / Self Care | Payer: Medicare HMO | Source: Ambulatory Visit | Attending: Orthopedic Surgery | Admitting: Orthopedic Surgery

## 2016-11-18 DIAGNOSIS — D2361 Other benign neoplasm of skin of right upper limb, including shoulder: Secondary | ICD-10-CM | POA: Diagnosis not present

## 2016-11-18 DIAGNOSIS — Z833 Family history of diabetes mellitus: Secondary | ICD-10-CM | POA: Diagnosis not present

## 2016-11-18 DIAGNOSIS — H9193 Unspecified hearing loss, bilateral: Secondary | ICD-10-CM | POA: Insufficient documentation

## 2016-11-18 DIAGNOSIS — Z803 Family history of malignant neoplasm of breast: Secondary | ICD-10-CM | POA: Insufficient documentation

## 2016-11-18 DIAGNOSIS — E559 Vitamin D deficiency, unspecified: Secondary | ICD-10-CM | POA: Diagnosis not present

## 2016-11-18 DIAGNOSIS — Z8249 Family history of ischemic heart disease and other diseases of the circulatory system: Secondary | ICD-10-CM | POA: Insufficient documentation

## 2016-11-18 DIAGNOSIS — Z800 Family history of malignant neoplasm of digestive organs: Secondary | ICD-10-CM | POA: Diagnosis not present

## 2016-11-18 DIAGNOSIS — Z8349 Family history of other endocrine, nutritional and metabolic diseases: Secondary | ICD-10-CM | POA: Diagnosis not present

## 2016-11-18 DIAGNOSIS — E785 Hyperlipidemia, unspecified: Secondary | ICD-10-CM | POA: Diagnosis not present

## 2016-11-18 DIAGNOSIS — D2111 Benign neoplasm of connective and other soft tissue of right upper limb, including shoulder: Secondary | ICD-10-CM | POA: Diagnosis not present

## 2016-11-18 DIAGNOSIS — R2231 Localized swelling, mass and lump, right upper limb: Secondary | ICD-10-CM | POA: Diagnosis not present

## 2016-11-18 HISTORY — DX: Nausea with vomiting, unspecified: Z98.890

## 2016-11-18 HISTORY — PX: MASS EXCISION: SHX2000

## 2016-11-18 HISTORY — DX: Nausea with vomiting, unspecified: R11.2

## 2016-11-18 SURGERY — EXCISION MASS
Anesthesia: Monitor Anesthesia Care | Site: Hand | Laterality: Right

## 2016-11-18 MED ORDER — CEFAZOLIN SODIUM-DEXTROSE 2-4 GM/100ML-% IV SOLN
INTRAVENOUS | Status: AC
  Filled 2016-11-18: qty 100

## 2016-11-18 MED ORDER — BUPIVACAINE HCL (PF) 0.25 % IJ SOLN
INTRAMUSCULAR | Status: DC | PRN
  Administered 2016-11-18: 6 mL

## 2016-11-18 MED ORDER — LACTATED RINGERS IV SOLN
INTRAVENOUS | Status: DC
  Administered 2016-11-18: 10:00:00 via INTRAVENOUS

## 2016-11-18 MED ORDER — FENTANYL CITRATE (PF) 100 MCG/2ML IJ SOLN
INTRAMUSCULAR | Status: AC
Start: 2016-11-18 — End: ?
  Filled 2016-11-18: qty 2

## 2016-11-18 MED ORDER — MIDAZOLAM HCL 2 MG/2ML IJ SOLN
INTRAMUSCULAR | Status: AC
  Filled 2016-11-18: qty 2

## 2016-11-18 MED ORDER — CHLORHEXIDINE GLUCONATE 4 % EX LIQD: 60.0000 mL | Freq: Once | CUTANEOUS | Status: DC

## 2016-11-18 MED ORDER — FENTANYL CITRATE (PF) 100 MCG/2ML IJ SOLN: 25.0000 ug | INTRAMUSCULAR | Status: DC | PRN

## 2016-11-18 MED ORDER — LIDOCAINE HCL (PF) 0.5 % IJ SOLN
INTRAMUSCULAR | Status: DC | PRN
  Administered 2016-11-18: 30 mL via INTRAVENOUS

## 2016-11-18 MED ORDER — MIDAZOLAM HCL 2 MG/2ML IJ SOLN
1.0000 mg | INTRAMUSCULAR | Status: DC | PRN
  Administered 2016-11-18: 2 mg via INTRAVENOUS

## 2016-11-18 MED ORDER — SCOPOLAMINE 1 MG/3DAYS TD PT72: 1.0000 | MEDICATED_PATCH | Freq: Once | TRANSDERMAL | Status: DC | PRN

## 2016-11-18 MED ORDER — FENTANYL CITRATE (PF) 100 MCG/2ML IJ SOLN
50.0000 ug | INTRAMUSCULAR | Status: DC | PRN
  Administered 2016-11-18: 100 ug via INTRAVENOUS

## 2016-11-18 MED ORDER — CEFAZOLIN SODIUM-DEXTROSE 2-4 GM/100ML-% IV SOLN
2.0000 g | INTRAVENOUS | Status: AC
  Administered 2016-11-18: 2 g via INTRAVENOUS

## 2016-11-18 MED ORDER — HYDROCODONE-ACETAMINOPHEN 5-325 MG PO TABS: 1.0000 | ORAL_TABLET | Freq: Four times a day (QID) | ORAL | 0 refills | Status: DC | PRN

## 2016-11-18 MED ORDER — ONDANSETRON HCL 4 MG/2ML IJ SOLN
INTRAMUSCULAR | Status: DC | PRN
  Administered 2016-11-18: 4 mg via INTRAVENOUS

## 2016-11-18 MED ORDER — PROPOFOL 10 MG/ML IV BOLUS
INTRAVENOUS | Status: DC | PRN
Start: 2016-11-18 — End: 2016-11-18
  Administered 2016-11-18 (×2): 10 mg via INTRAVENOUS

## 2016-11-18 SURGICAL SUPPLY — 41 items
BANDAGE COBAN STERILE 2 (GAUZE/BANDAGES/DRESSINGS) ×2 IMPLANT
BLADE SURG 15 STRL LF DISP TIS (BLADE) ×1 IMPLANT
BLADE SURG 15 STRL SS (BLADE) ×1
BNDG COHESIVE 1X5 TAN STRL LF (GAUZE/BANDAGES/DRESSINGS) IMPLANT
BNDG COHESIVE 3X5 TAN STRL LF (GAUZE/BANDAGES/DRESSINGS) IMPLANT
BNDG ESMARK 4X9 LF (GAUZE/BANDAGES/DRESSINGS) IMPLANT
BNDG GAUZE ELAST 4 BULKY (GAUZE/BANDAGES/DRESSINGS) IMPLANT
CHLORAPREP W/TINT 26ML (MISCELLANEOUS) ×2 IMPLANT
CORD BIPOLAR FORCEPS 12FT (ELECTRODE) ×2 IMPLANT
COVER BACK TABLE 60X90IN (DRAPES) ×2 IMPLANT
COVER MAYO STAND STRL (DRAPES) ×2 IMPLANT
CUFF TOURNIQUET SINGLE 18IN (TOURNIQUET CUFF) ×2 IMPLANT
DECANTER SPIKE VIAL GLASS SM (MISCELLANEOUS) IMPLANT
DRAIN PENROSE 1/2X12 LTX STRL (WOUND CARE) IMPLANT
DRAPE EXTREMITY T 121X128X90 (DRAPE) ×2 IMPLANT
DRAPE SURG 17X23 STRL (DRAPES) ×2 IMPLANT
GAUZE SPONGE 4X4 12PLY STRL (GAUZE/BANDAGES/DRESSINGS) ×2 IMPLANT
GAUZE XEROFORM 1X8 LF (GAUZE/BANDAGES/DRESSINGS) ×2 IMPLANT
GLOVE BIO SURGEON STRL SZ 6.5 (GLOVE) ×4 IMPLANT
GLOVE BIOGEL PI IND STRL 8.5 (GLOVE) ×1 IMPLANT
GLOVE BIOGEL PI INDICATOR 8.5 (GLOVE) ×1
GLOVE SURG ORTHO 8.0 STRL STRW (GLOVE) ×2 IMPLANT
GOWN STRL REUS W/ TWL LRG LVL3 (GOWN DISPOSABLE) ×1 IMPLANT
GOWN STRL REUS W/TWL LRG LVL3 (GOWN DISPOSABLE) ×1
GOWN STRL REUS W/TWL XL LVL3 (GOWN DISPOSABLE) ×2 IMPLANT
NDL SAFETY ECLIPSE 18X1.5 (NEEDLE) IMPLANT
NEEDLE HYPO 18GX1.5 SHARP (NEEDLE)
NEEDLE PRECISIONGLIDE 27X1.5 (NEEDLE) ×2 IMPLANT
NS IRRIG 1000ML POUR BTL (IV SOLUTION) ×2 IMPLANT
PACK BASIN DAY SURGERY FS (CUSTOM PROCEDURE TRAY) ×2 IMPLANT
PAD CAST 3X4 CTTN HI CHSV (CAST SUPPLIES) IMPLANT
PADDING CAST COTTON 3X4 STRL (CAST SUPPLIES)
SPLINT PLASTER CAST XFAST 3X15 (CAST SUPPLIES) IMPLANT
SPLINT PLASTER XTRA FASTSET 3X (CAST SUPPLIES)
STOCKINETTE 4X48 STRL (DRAPES) ×2 IMPLANT
SUT ETHILON 4 0 PS 2 18 (SUTURE) ×2 IMPLANT
SUT VIC AB 4-0 P2 18 (SUTURE) IMPLANT
SYR BULB 3OZ (MISCELLANEOUS) ×2 IMPLANT
SYR CONTROL 10ML LL (SYRINGE) ×2 IMPLANT
TOWEL OR 17X24 6PK STRL BLUE (TOWEL DISPOSABLE) ×2 IMPLANT
UNDERPAD 30X30 (UNDERPADS AND DIAPERS) ×2 IMPLANT

## 2016-11-18 NOTE — Anesthesia Postprocedure Evaluation (Signed)
Anesthesia Post Note  Patient: Rebecca Howe  Procedure(s) Performed: EXCISIONAL BIOPSY MASS RIGHT LST WEB (Right Hand)     Patient location during evaluation: PACU Anesthesia Type: MAC Level of consciousness: awake and alert Pain management: pain level controlled Vital Signs Assessment: post-procedure vital signs reviewed and stable Respiratory status: spontaneous breathing, nonlabored ventilation, respiratory function stable and patient connected to nasal cannula oxygen Cardiovascular status: stable and blood pressure returned to baseline Postop Assessment: no apparent nausea or vomiting Anesthetic complications: no    Last Vitals:  Vitals:   11/18/16 1139 11/18/16 1150  BP: (!) 143/78 (!) 150/77  Pulse: 70 63  Resp: 14 16  Temp:  36.5 C  SpO2: 96% 99%    Last Pain:  Vitals:   11/18/16 1150  TempSrc:   PainSc: 0-No pain                 Marcelene Weidemann EDWARD

## 2016-11-18 NOTE — H&P (Signed)
  Rebecca Howe is an 66 y.o. female.   Chief Complaint: mass right hand HPI: Rebecca Howe is a 66 year old right-hand-dominant female referred by Rebecca Sax PA for consultation regarding a mass in her first webspace right hand. This been present since May. Is not causing her any pain. She has no history of injury. She states it only bothers her when she grips or pinches. She has not taken or tried anything for this. She is of [place] descent. She has no siblings with fingers that have been contracted. She has no history diabetes thyroid problems she does have a history of arthritis she has no history of gout. Family history is positive diabetes negative for thyroid problems arthritis and gout. She has been tested for diabetes.She was sent for an ultrasound to see if this was solid or cystic. This had been done revealing a solid mass 1.3 x 0.6 x 1 cm with indistinct margins.          Past Medical History:  Diagnosis Date  . Hearing loss since childhood   bilateral hearing loss  . Irregular menses   . PONV (postoperative nausea and vomiting)   . Skin lesion of left leg 12/08   atypical    Past Surgical History:  Procedure Laterality Date  . BREAST LUMPECTOMY Left 1995   benign  . COLONOSCOPY  07/04/2005   recheck in 10 years - Dr Rebecca Howe  . COLPOSCOPY  02/1996   benign  . COLPOSCOPY  12/1998   benign  . TUBAL LIGATION Bilateral 1991    Family History  Problem Relation Age of Onset  . Hypertension Father   . Diabetes Paternal Grandmother   . Hypertension Mother   . Thyroid disease Mother   . Hypertension Brother   . Cancer Paternal Aunt 24       colon cancer  . Breast cancer Paternal Aunt    Social History:  reports that she has never smoked. She has never used smokeless tobacco. She reports that she does not drink alcohol or use drugs.  Allergies: No Known Allergies  No prescriptions prior to admission.    No results found for this or any previous visit (from the  past 48 hour(s)).  No results found.   Pertinent items are noted in HPI.  Height 5' 7.5" (1.715 m), weight 79.4 kg (175 lb), last menstrual period 11/17/2001.  General appearance: alert, cooperative and appears stated age Head: Normocephalic, without obvious abnormality Neck: no JVD Resp: clear to auscultation bilaterally Cardio: regular rate and rhythm, S1, S2 normal, no murmur, click, rub or gallop GI: soft, non-tender; bowel sounds normal; no masses,  no organomegaly Extremities:  mass right hand 1st web space Pulses: 2+ and symmetric Skin: Skin color, texture, turgor normal. 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     Plan: Her ultrasound is reviewed with her and her husband. She would like to have this removed. Pre-peri-and postoperative course been discussed along with risk complications. She is where there is no guarantee to the surgery the possibility of infection recurrence injury to arteries nerves tendons recurrence. She would like to proceed. She is scheduled for excision mass first webspace right hand as an outpatient under regional anesthesia.      Rebecca Howe R 11/18/2016, 4:30 AM

## 2016-11-18 NOTE — Discharge Instructions (Addendum)

## 2016-11-18 NOTE — Brief Op Note (Signed)
11/18/2016  11:13 AM  PATIENT:  Rebecca Howe  66 y.o. female  PRE-OPERATIVE DIAGNOSIS:  MASS RIGHT LST WEB  POST-OPERATIVE DIAGNOSIS:  MASS RIGHT LST WEB  PROCEDURE:  Procedure(s) with comments: EXCISIONAL BIOPSY MASS RIGHT LST WEB (Right) - REG/FAB  SURGEON:  Surgeon(s) and Role:    Daryll Brod, MD - Primary  PHYSICIAN ASSISTANT:   ASSISTANTS: none   ANESTHESIA:   local and regional  EBL:  Total I/O In: -  Out: 2 [Blood:2]  BLOOD ADMINISTERED:none  DRAINS: none   LOCAL MEDICATIONS USED:  BUPIVICAINE   SPECIMEN:  Excision  DISPOSITION OF SPECIMEN:  PATHOLOGY  COUNTS:  YES  TOURNIQUET:   Total Tourniquet Time Documented: Forearm (laterality) - 23 minutes Total: Forearm (laterality) - 23 minutes   DICTATION: .Other Dictation: Dictation Number [id number]  PLAN OF CARE: Discharge to home after PACU  PATIENT DISPOSITION:  PACU - hemodynamically stable.

## 2016-11-18 NOTE — Op Note (Signed)
Dictation Number [id number]

## 2016-11-18 NOTE — Anesthesia Procedure Notes (Signed)
Anesthesia Regional Block: Bier block (IV Regional)   Pre-Anesthetic Checklist: ,, timeout performed, Correct Patient, Correct Site, Correct Laterality, Correct Procedure,, site marked, surgical consent,, at surgeon's request Needles:   Needle Type: Other      Needle Gauge: 20     Additional Needles:   Procedures:,,,,, intact distal pulses, Esmarch exsanguination, single tourniquet utilized,  Narrative:   Performed by: [employer]

## 2016-11-18 NOTE — Anesthesia Preprocedure Evaluation (Addendum)
Anesthesia Evaluation  Patient identified by MRN, date of birth, ID band Patient awake    Reviewed: Allergy & Precautions, H&P , Patient's Chart, lab work & pertinent test results, reviewed documented beta blocker date and time   Airway Mallampati: II  TM Distance: >3 FB Neck ROM: full    Dental no notable dental hx.    Pulmonary    Pulmonary exam normal breath sounds clear to auscultation       Cardiovascular  Rhythm:regular Rate:Normal     Neuro/Psych    GI/Hepatic   Endo/Other    Renal/GU      Musculoskeletal   Abdominal   Peds  Hematology   Anesthesia Other Findings   Reproductive/Obstetrics                             Anesthesia Physical Anesthesia Plan  ASA: II  Anesthesia Plan: Bier Block and MAC   Post-op Pain Management:    Induction: Intravenous  PONV Risk Score and Plan: 1 and Ondansetron and Dexamethasone  Airway Management Planned: Mask  Additional Equipment:   Intra-op Plan:   Post-operative Plan:   Informed Consent: I have reviewed the patients History and Physical, chart, labs and discussed the procedure including the risks, benefits and alternatives for the proposed anesthesia with the patient or authorized representative who has indicated his/her understanding and acceptance.   Dental Advisory Given  Plan Discussed with: CRNA and Surgeon  Anesthesia Plan Comments: (  )       Anesthesia Quick Evaluation

## 2016-11-18 NOTE — Op Note (Signed)
Rebecca Howe, Rebecca Howe NO.:  [id number]  MEDICAL RECORD NO.:  [id number]  LOCATION:                                 FACILITY:  PHYSICIAN:  Daryll Brod, M.D.            DATE OF BIRTH:  DATE OF PROCEDURE:  11/18/2016 DATE OF DISCHARGE:                              OPERATIVE REPORT   PREOPERATIVE DIAGNOSIS:  Mass, first webspace, right hand.  POSTOPERATIVE DIAGNOSIS:  Mass, first webspace, right hand.  OPERATION:  Excisional biopsy of mass, first webspace, right hand.  SURGEON:  Daryll Brod, M.D.  ASSISTANT:  None.  ANESTHESIA:  Forearm-based IV regional with IV sedation.  PLACE OF SURGERY:  [facility].  ANESTHESIOLOGIST:  Dr. Glennon Mac.  HISTORY:  The patient is a 66 year old female with a history of a mass in her first webspace, right hand.  She is desirous of having this excised.  Ultrasound reveals that this is solid, not cystic.  Pre, peri, postoperative course have been discussed along with risks and complications.  She is aware that there is no guarantee to the surgery; the possibility of infection; recurrence of injury to arteries, nerves, tendons; incomplete relief of symptoms; and dystrophy.  In preoperative area, the patient was seen, the extremity marked by both the patient and surgeon.  Antibiotic given.  PROCEDURE IN DETAIL:  The patient was brought to the operating room where a forearm-based IV regional anesthetic was carried out without difficulty on her right arm.  This was done by the Anesthesia Department.  She was prepped using ChloraPrep in a supine position with the right arm free.  A 3-minute dry time was allowed.  Time-out taken, confirming the patient and procedure.  A volar zigzag incision was made in the first webspace, carried down through subcutaneous tissue.  Mass was immediately apparent.  This appeared to be in the natatory fibers between the thumb and index finger.  This was dissected free and the entire area was  sent to Pathology.  The V's were then converted into Y's and the area irrigated.  Care was taken to protect neurovascular bundles radially and ulnarly.  This was on the radial aspect of the index, ulnar aspect of the thumb.  The skin was then closed with interrupted 4-0 nylon sutures.  Local infiltration with 0.25% bupivacaine without epinephrine was given.  Approximately 6 mL was used.  A sterile compressive dressing with the fingers free and thumb free applied.  On deflation of the tourniquet, all fingers immediately pinked.  She was taken to the recovery room for observation in satisfactory condition. She will be discharged to home to return to the [facility] in 1 week, on Norco.          ______________________________ Daryll Brod, M.D.     GK/MEDQ  D:  11/18/2016  T:  11/18/2016  Job:  [id number]

## 2016-11-18 NOTE — Transfer of Care (Signed)
Immediate Anesthesia Transfer of Care Note  Patient: Rebecca Howe  Procedure(s) Performed: EXCISIONAL BIOPSY MASS RIGHT LST WEB (Right Hand)  Patient Location: PACU  Anesthesia Type:Bier block  Level of Consciousness: awake, alert  and oriented  Airway & Oxygen Therapy: Patient Spontanous Breathing and Patient connected to face mask oxygen  Post-op Assessment: Report given to RN and Post -op Vital signs reviewed and stable  Post vital signs: Reviewed and stable  Last Vitals:  Vitals:   11/18/16 0930  BP: (!) 142/83  Pulse: 69  Resp: 18  Temp: 36.7 C  SpO2: 100%    Last Pain:  Vitals:   11/18/16 0930  TempSrc: Oral      Patients Stated Pain Goal: 0 (47/09/29 5747)  Complications: No apparent anesthesia complications

## 2016-11-19 ENCOUNTER — Encounter (HOSPITAL_BASED_OUTPATIENT_CLINIC_OR_DEPARTMENT_OTHER): Payer: Self-pay | Admitting: Orthopedic Surgery

## 2016-11-26 DIAGNOSIS — D219 Benign neoplasm of connective and other soft tissue, unspecified: Secondary | ICD-10-CM | POA: Insufficient documentation

## 2016-12-30 DIAGNOSIS — Z0100 Encounter for examination of eyes and vision without abnormal findings: Secondary | ICD-10-CM | POA: Diagnosis not present

## 2017-01-20 ENCOUNTER — Other Ambulatory Visit: Payer: Self-pay

## 2017-01-20 ENCOUNTER — Ambulatory Visit (INDEPENDENT_AMBULATORY_CARE_PROVIDER_SITE_OTHER): Payer: Medicare HMO | Admitting: Family Medicine

## 2017-01-20 ENCOUNTER — Encounter: Payer: Self-pay | Admitting: Family Medicine

## 2017-01-20 VITALS — BP 128/74 | HR 78 | Temp 98.7°F | Resp 14 | Ht 67.5 in | Wt 179.0 lb

## 2017-01-20 DIAGNOSIS — E785 Hyperlipidemia, unspecified: Secondary | ICD-10-CM

## 2017-01-20 DIAGNOSIS — E559 Vitamin D deficiency, unspecified: Secondary | ICD-10-CM | POA: Diagnosis not present

## 2017-01-20 DIAGNOSIS — Z0000 Encounter for general adult medical examination without abnormal findings: Secondary | ICD-10-CM

## 2017-01-20 DIAGNOSIS — Z23 Encounter for immunization: Secondary | ICD-10-CM | POA: Diagnosis not present

## 2017-01-20 DIAGNOSIS — Z8249 Family history of ischemic heart disease and other diseases of the circulatory system: Secondary | ICD-10-CM | POA: Diagnosis not present

## 2017-01-20 DIAGNOSIS — G8929 Other chronic pain: Secondary | ICD-10-CM

## 2017-01-20 DIAGNOSIS — M533 Sacrococcygeal disorders, not elsewhere classified: Secondary | ICD-10-CM

## 2017-01-20 MED ORDER — DIAZEPAM 5 MG PO TABS: ORAL_TABLET | ORAL | 0 refills | Status: DC

## 2017-01-20 MED ORDER — TIZANIDINE HCL 4 MG PO CAPS: 4.0000 mg | ORAL_CAPSULE | Freq: Three times a day (TID) | ORAL | 1 refills | Status: DC

## 2017-01-20 NOTE — Progress Notes (Signed)
Subjective:   Patient presents for Medicare Annual/Subsequent preventive examination.     Her father had abdominal aortic aneurysm she would like to be screened for this. She still taking her supplements as prescribed. She does have history of chronic SI joint pain she was seen by orthopedics last year.  They prescribed her muscle relaxant which helps she is also been taking Advil.  Over the past week or so she has had some irritation to the area.  She denies any radiating symptoms in her lower extremities.  She would like a muscle relaxer prescribed.  She is fearful of the dentist we discussed this in the past she would like to use Valium prior to her dental visit.   Review Past Medical/Family/Social: Per EMR    Risk Factors  Current exercise habits: [facility] Dietary issues discussed: Yes  Cardiac risk factors:  Hyerlipidemia   Depression Screen  (Note: if answer to either of the following is "Yes", a more complete depression screening is indicated)  Over the past two weeks, have you felt down, depressed or hopeless? No Over the past two weeks, have you felt little interest or pleasure in doing things? No Have you lost interest or pleasure in daily life? No Do you often feel hopeless? No Do you cry easily over simple problems? No   Activities of Daily Living  In your present state of health, do you have any difficulty performing the following activities?:  Driving? No  Managing money? No  Feeding yourself? No  Getting from bed to chair? No  Climbing a flight of stairs? No  Preparing food and eating?: No  Bathing or showering? No  Getting dressed: No  Getting to the toilet? No  Using the toilet:No  Moving around from place to place: No  In the past year have you fallen or had a near fall?:No  Are you sexually active? No  Do you have more than one partner? No   Hearing Difficulties: wears hearing aides Do you often ask people to speak up or repeat themselves? No   Do you experience ringing or noises in your ears? No Do you have difficulty understanding soft or whispered voices? Yes Do you feel that you have a problem with memory? No Do you often misplace items? No  Do you feel safe at home? Yes  Cognitive Testing  Alert? Yes Normal Appearance?Yes  Oriented to person? Yes Place? Yes  Time? Yes  Recall of three objects? Yes  Can perform simple calculations? Yes  Displays appropriate judgment?Yes  Can read the correct time from a watch face?Yes   List the Names of Other Physician/Practitioners you currently use:   GYN  Audiology  Screening Tests / Date UTD Colonoscopy                     Zostavax  Shingrix- Would like  Mammogram  Influenza Vaccine  Tetanus/tdap Pneumonia- Due for prevnar 13  ROS:  GEN- denies fatigue, fever, weight loss,weakness, recent illness HEENT- denies eye drainage, change in vision, nasal discharge, CVS- denies chest pain, palpitations RESP- denies SOB, cough, wheeze ABD- denies N/V, change in stools, abd pain GU- denies dysuria, hematuria, dribbling, incontinence MSK- + joint pain, muscle aches, injury Neuro- denies headache, dizziness, syncope, seizure activity  PHYSICAL:  GEN- NAD, alert and oriented x3 HEENT- PERRL, EOMI, non injected sclera, pink conjunctiva, MMM, oropharynx clea rhearing aids Neck- Supple, no thryomegaly CVS- RRR, no murmur RESP-CTAB ABD-NABS,soft,NT,ND MSK- Spine NT, mild TTP over  left SI joint region, neg SLR, fair ROM spine, hips/knees  EXT- No edema Pulses- Radial, DP- 2+      Assessment:    Annual wellness medicare exam   Plan:    During the course of the visit the patient was educated and counseled about appropriate screening and preventive services including:    Shingles vaccine. Shingrix to be ordered .   Prenvar 13 to be ordered   Screen Neg for depression.     Will attempt to get AAA screening via Ultrasound, she is non smoker, no HTN     Valium 1 hour  before dental procedure, advised needs driver accompanying her    SI joint- recommend exercise/Yoga, given zanaflex temporarily continue NAIDS     Has advanced directives    Diet review for nutrition referral? Yes ____ Not Indicated __x__  Patient Instructions (the written plan) was given to the patient.  Medicare Attestation  I have personally reviewed:  The patient's medical and social history  Their use of alcohol, tobacco or illicit drugs  Their current medications and supplements  The patient's functional ability including ADLs,fall risks, home safety risks, cognitive, and hearing and visual impairment  Diet and physical activities  Evidence for depression or mood disorders  The patient's weight, height, BMI, and visual acuity have been recorded in the chart. I have made referrals, counseling, and provided education to the patient based on review of the above and I have provided the patient with a written personalized care plan for preventive services.

## 2017-01-20 NOTE — Patient Instructions (Addendum)
Prevnar 13 today  Shingrix to be ordered Set AAA screening to be done  Fasting labs  Valium to be used before dental procedures Zanaflex as needed  Try the yoga  We will call with lab results results  F/U 1 year for Physical or as needed

## 2017-01-21 ENCOUNTER — Other Ambulatory Visit: Payer: Self-pay | Admitting: *Deleted

## 2017-01-21 DIAGNOSIS — R69 Illness, unspecified: Secondary | ICD-10-CM | POA: Diagnosis not present

## 2017-01-21 LAB — CBC WITH DIFFERENTIAL/PLATELET
BASOS ABS: 28 {cells}/uL (ref 0–200)
BASOS PCT: 0.6 %
EOS ABS: 99 {cells}/uL (ref 15–500)
Eosinophils Relative: 2.1 %
HEMATOCRIT: 38 % (ref 35.0–45.0)
HEMOGLOBIN: 12.6 g/dL (ref 11.7–15.5)
Lymphs Abs: 1499 cells/uL (ref 850–3900)
MCH: 29.1 pg (ref 27.0–33.0)
MCHC: 33.2 g/dL (ref 32.0–36.0)
MCV: 87.8 fL (ref 80.0–100.0)
MPV: 10.7 fL (ref 7.5–12.5)
Monocytes Relative: 7.4 %
NEUTROS ABS: 2726 {cells}/uL (ref 1500–7800)
Neutrophils Relative %: 58 %
Platelets: 228 10*3/uL (ref 140–400)
RBC: 4.33 10*6/uL (ref 3.80–5.10)
RDW: 12.4 % (ref 11.0–15.0)
Total Lymphocyte: 31.9 %
WBC: 4.7 10*3/uL (ref 3.8–10.8)
WBCMIX: 348 {cells}/uL (ref 200–950)

## 2017-01-21 LAB — COMPREHENSIVE METABOLIC PANEL
AG RATIO: 1.5 (calc) (ref 1.0–2.5)
ALKALINE PHOSPHATASE (APISO): 84 U/L (ref 33–130)
ALT: 15 U/L (ref 6–29)
AST: 24 U/L (ref 10–35)
Albumin: 4.1 g/dL (ref 3.6–5.1)
BUN: 12 mg/dL (ref 7–25)
CALCIUM: 9.7 mg/dL (ref 8.6–10.4)
CO2: 30 mmol/L (ref 20–32)
Chloride: 104 mmol/L (ref 98–110)
Creat: 0.85 mg/dL (ref 0.50–0.99)
GLOBULIN: 2.8 g/dL (ref 1.9–3.7)
Glucose, Bld: 91 mg/dL (ref 65–99)
POTASSIUM: 4.7 mmol/L (ref 3.5–5.3)
SODIUM: 140 mmol/L (ref 135–146)
Total Bilirubin: 0.4 mg/dL (ref 0.2–1.2)
Total Protein: 6.9 g/dL (ref 6.1–8.1)

## 2017-01-21 LAB — LIPID PANEL
CHOL/HDL RATIO: 3.8 (calc) (ref <5.0)
Cholesterol: 222 mg/dL — ABNORMAL HIGH (ref <200)
HDL: 58 mg/dL (ref >50)
LDL CHOLESTEROL (CALC): 146 mg/dL — AB
NON-HDL CHOLESTEROL (CALC): 164 mg/dL — AB (ref <130)
TRIGLYCERIDES: 75 mg/dL (ref <150)

## 2017-01-21 LAB — VITAMIN D 25 HYDROXY (VIT D DEFICIENCY, FRACTURES): Vit D, 25-Hydroxy: 45 ng/mL (ref 30–100)

## 2017-01-21 MED ORDER — TIZANIDINE HCL 4 MG PO TABS: 4.0000 mg | ORAL_TABLET | Freq: Four times a day (QID) | ORAL | 1 refills | Status: DC | PRN

## 2017-01-28 ENCOUNTER — Ambulatory Visit
Admission: RE | Admit: 2017-01-28 | Discharge: 2017-01-28 | Disposition: A | Discharge status: Home / Self Care | Payer: Medicare HMO | Source: Ambulatory Visit | Attending: Family Medicine | Admitting: Family Medicine

## 2017-01-28 DIAGNOSIS — Z136 Encounter for screening for cardiovascular disorders: Secondary | ICD-10-CM | POA: Diagnosis not present

## 2017-01-28 DIAGNOSIS — Z8249 Family history of ischemic heart disease and other diseases of the circulatory system: Secondary | ICD-10-CM

## 2017-02-25 ENCOUNTER — Encounter: Payer: Self-pay | Admitting: Family Medicine

## 2017-03-06 ENCOUNTER — Other Ambulatory Visit: Payer: Self-pay | Admitting: *Deleted

## 2017-03-06 ENCOUNTER — Other Ambulatory Visit: Payer: Self-pay

## 2017-03-06 ENCOUNTER — Ambulatory Visit (INDEPENDENT_AMBULATORY_CARE_PROVIDER_SITE_OTHER): Payer: Medicare HMO | Admitting: *Deleted

## 2017-03-06 ENCOUNTER — Encounter: Payer: Self-pay | Admitting: *Deleted

## 2017-03-06 DIAGNOSIS — Z23 Encounter for immunization: Secondary | ICD-10-CM

## 2017-03-06 DIAGNOSIS — E785 Hyperlipidemia, unspecified: Secondary | ICD-10-CM

## 2017-03-06 NOTE — Progress Notes (Signed)
Patient seen in office for Shingles Vaccination.   Tolerated IM administration well.   Immunization history updated.  

## 2017-03-31 ENCOUNTER — Ambulatory Visit: Payer: BLUE CROSS/BLUE SHIELD | Admitting: Nurse Practitioner

## 2017-04-01 ENCOUNTER — Other Ambulatory Visit: Payer: Self-pay

## 2017-04-01 ENCOUNTER — Ambulatory Visit (INDEPENDENT_AMBULATORY_CARE_PROVIDER_SITE_OTHER): Payer: Medicare HMO | Admitting: Certified Nurse Midwife

## 2017-04-01 ENCOUNTER — Encounter: Payer: Self-pay | Admitting: Certified Nurse Midwife

## 2017-04-01 ENCOUNTER — Other Ambulatory Visit (HOSPITAL_COMMUNITY)
Admission: RE | Admit: 2017-04-01 | Discharge: 2017-04-01 | Disposition: A | Discharge status: Home / Self Care | Payer: Medicare HMO | Source: Ambulatory Visit | Attending: Certified Nurse Midwife | Admitting: Certified Nurse Midwife

## 2017-04-01 VITALS — BP 124/84 | HR 72 | Resp 16 | Ht 67.5 in | Wt 179.0 lb

## 2017-04-01 DIAGNOSIS — H9193 Unspecified hearing loss, bilateral: Secondary | ICD-10-CM | POA: Diagnosis not present

## 2017-04-01 DIAGNOSIS — Z124 Encounter for screening for malignant neoplasm of cervix: Secondary | ICD-10-CM

## 2017-04-01 DIAGNOSIS — Z01419 Encounter for gynecological examination (general) (routine) without abnormal findings: Secondary | ICD-10-CM

## 2017-04-01 DIAGNOSIS — Z780 Asymptomatic menopausal state: Secondary | ICD-10-CM

## 2017-04-01 NOTE — Patient Instructions (Signed)

## 2017-04-01 NOTE — Progress Notes (Signed)
67 y.o. G2P2002 Married  Caucasian Fe here for annual exam. Menopausal no HRT. Denies vaginal bleeding or vaginal dryness. Sees Dr. [place] for aex,labs,medication management for stress with Dentist appointments only. Occasional urinary frequency and stress incontinence, no issues. Hearing loss has increased, wearing hearing aids now. No other health issues today.  Patient's last menstrual period was 11/17/2001 (approximate).          Sexually active: Yes.    The current method of family planning is tubal ligation.    Exercising: Yes.    walking Smoker:  no  Health Maintenance: Pap:  12-20-14 neg HPV HR neg History of Abnormal Pap: yes MMG:  09-01-16 category b density birads 1:neg Self Breast exams: some Colonoscopy: 2017 f/u 5yrs BMD:   2016 TDaP:  2012 Shingles: 2013, 2019 Pneumonia: 2018 Hep C and HIV: Hep c neg 2016 Labs: with PCP   reports that  has never smoked. she has never used smokeless tobacco. She reports that she does not drink alcohol or use drugs.  Past Medical History:  Diagnosis Date  . Hearing loss since childhood   bilateral hearing loss  . Irregular menses   . PONV (postoperative nausea and vomiting)   . Skin lesion of left leg 12/08   atypical    Past Surgical History:  Procedure Laterality Date  . BREAST LUMPECTOMY Left 1995   benign  . COLONOSCOPY  07/04/2005   recheck in 10 years - Dr Mann  . COLPOSCOPY  02/1996   benign  . COLPOSCOPY  12/1998   benign  . MASS EXCISION Right 11/18/2016   Procedure: EXCISIONAL BIOPSY MASS RIGHT LST WEB;  Surgeon: Kuzma, Gary, MD;  Location: [facility];  Service: Orthopedics;  Laterality: Right;  REG/FAB  . TUBAL LIGATION Bilateral 1991    Current Outpatient Medications  Medication Sig Dispense Refill  . acetaminophen (TYLENOL) 500 MG tablet Take 500 mg by mouth every 6 (six) hours as needed.    . cholecalciferol (VITAMIN D) 1000 UNITS tablet Take 1,000 Units by mouth daily. 2 tabs Daily    .  ibuprofen (ADVIL,MOTRIN) 200 MG tablet Take 200 mg by mouth every 6 (six) hours as needed.    . Lactobacillus (PROBIOTIC ACIDOPHILUS PO) Take by mouth.    . Multiple Vitamin (MULTIVITAMIN) tablet Take 1 tablet by mouth daily.    . TURMERIC PO Take 1,500 mg by mouth.    . diazepam (VALIUM) 5 MG tablet Take 1 tablet 1 hour before dental procedure (Patient not taking: Reported on 04/01/2017) 3 tablet 0   No current facility-administered medications for this visit.     Family History  Problem Relation Age of Onset  . Hypertension Father   . Diabetes Paternal Grandmother   . Hypertension Mother   . Thyroid disease Mother   . Hypertension Brother   . Cancer Paternal Aunt 88       colon cancer  . Breast cancer Paternal Aunt     ROS:  Pertinent items are noted in HPI.  Otherwise, a comprehensive ROS was negative.  Exam:   BP 124/84   Pulse 72   Resp 16   Ht 5' 7.5" (1.715 m)   Wt 179 lb (81.2 kg)   LMP 11/17/2001 (Approximate)   BMI 27.62 kg/m  Height: 5' 7.5" (171.5 cm) Ht Readings from Last 3 Encounters:  04/01/17 5' 7.5" (1.715 m)  01/20/17 5' 7.5" (1.715 m)  11/18/16 5' 7" (1.702 m)    General appearance: alert, cooperative   and appears stated age Head: Normocephalic, without obvious abnormality, atraumatic Neck: no adenopathy, supple, symmetrical, trachea midline and thyroid normal to inspection and palpation Lungs: clear to auscultation bilaterally Breasts: normal appearance, no masses or tenderness, No nipple retraction or dimpling, No nipple discharge or bleeding, No axillary or supraclavicular adenopathy Heart: regular rate and rhythm Abdomen: soft, non-tender; no masses,  no organomegaly Extremities: extremities normal, atraumatic, no cyanosis or edema Skin: Skin color, texture, turgor normal. No rashes or lesions Lymph nodes: Cervical, supraclavicular, and axillary nodes normal. No abnormal inguinal nodes palpated Neurologic: Grossly normal   Pelvic: External  genitalia:  no lesions              Urethra:  normal appearing urethra with no masses, tenderness or lesions              Bartholin's and Skene's: normal                 Vagina: normal appearing vagina with normal color and discharge, no lesions              Cervix: no bleeding following Pap, no cervical motion tenderness and no lesions              Pap taken: Yes.   Bimanual Exam:  Uterus:  normal size, contour, position, consistency, mobility, non-tender and anteverted              Adnexa: normal adnexa and no mass, fullness, tenderness               Rectovaginal: Confirms               Anus:  normal sphincter tone, no lesions  Chaperone present: yes  A:  Well Woman with normal exam  Menopausal no HRT  Hearing loss increase with hearing aid use  Anxiety with dental appointments with PCP management of Valium  BMD due  P:   Reviewed health and wellness pertinent to exam  Discussed need to evaluate if vaginal bleeding  Continue follow up as indicated with MD  Patient will schedule with mammogram  Pap smear: yes   counseled on breast self exam, mammography screening, feminine hygiene, adequate intake of calcium and vitamin D, diet and exercise, Kegel's exercises  return annually or prn  An After Visit Summary was printed and given to the patient.

## 2017-04-02 LAB — CYTOLOGY - PAP
Diagnosis: NEGATIVE
HPV: NOT DETECTED

## 2017-05-18 ENCOUNTER — Other Ambulatory Visit: Payer: Self-pay

## 2017-05-18 ENCOUNTER — Ambulatory Visit (INDEPENDENT_AMBULATORY_CARE_PROVIDER_SITE_OTHER): Payer: Medicare HMO

## 2017-05-18 DIAGNOSIS — Z23 Encounter for immunization: Secondary | ICD-10-CM

## 2017-05-18 NOTE — Progress Notes (Signed)
Patient received shingrix vaccine to left arm. Vaccine was given subq. Patient tolerated well.

## 2017-06-08 ENCOUNTER — Other Ambulatory Visit: Payer: Medicare HMO

## 2017-06-08 DIAGNOSIS — E785 Hyperlipidemia, unspecified: Secondary | ICD-10-CM

## 2017-06-08 LAB — LIPID PANEL
Cholesterol: 230 mg/dL — ABNORMAL HIGH (ref <200)
HDL: 50 mg/dL — ABNORMAL LOW (ref >50)
LDL CHOLESTEROL (CALC): 154 mg/dL — AB
NON-HDL CHOLESTEROL (CALC): 180 mg/dL — AB (ref <130)
TRIGLYCERIDES: 131 mg/dL (ref <150)
Total CHOL/HDL Ratio: 4.6 (calc) (ref <5.0)

## 2017-06-26 ENCOUNTER — Encounter: Payer: Self-pay | Admitting: Family Medicine

## 2017-06-26 ENCOUNTER — Ambulatory Visit (INDEPENDENT_AMBULATORY_CARE_PROVIDER_SITE_OTHER): Payer: Medicare HMO | Admitting: Family Medicine

## 2017-06-26 ENCOUNTER — Other Ambulatory Visit: Payer: Self-pay

## 2017-06-26 VITALS — BP 130/84 | HR 95 | Temp 99.1°F | Ht 67.5 in | Wt 175.0 lb

## 2017-06-26 DIAGNOSIS — J209 Acute bronchitis, unspecified: Secondary | ICD-10-CM | POA: Diagnosis not present

## 2017-06-26 DIAGNOSIS — J069 Acute upper respiratory infection, unspecified: Secondary | ICD-10-CM | POA: Diagnosis not present

## 2017-06-26 LAB — INFLUENZA A AND B AG, IMMUNOASSAY
INFLUENZA A ANTIGEN: NOT DETECTED
INFLUENZA B ANTIGEN: NOT DETECTED

## 2017-06-26 MED ORDER — PREDNISONE 20 MG PO TABS: 40.0000 mg | ORAL_TABLET | Freq: Every day | ORAL | 0 refills | Status: AC

## 2017-06-26 MED ORDER — IPRATROPIUM-ALBUTEROL 0.5-2.5 (3) MG/3ML IN SOLN
3.0000 mL | Freq: Once | RESPIRATORY_TRACT | Status: AC
  Administered 2017-06-26: 3 mL via RESPIRATORY_TRACT

## 2017-06-26 MED ORDER — ALBUTEROL SULFATE HFA 108 (90 BASE) MCG/ACT IN AERS: 2.0000 | INHALATION_SPRAY | RESPIRATORY_TRACT | 0 refills | Status: DC | PRN

## 2017-06-26 MED ORDER — BENZONATATE 100 MG PO CAPS: 100.0000 mg | ORAL_CAPSULE | Freq: Three times a day (TID) | ORAL | 0 refills | Status: DC | PRN

## 2017-06-26 NOTE — Patient Instructions (Signed)
You likely have a viral upper respiratory infection, and the treatment is largely supportive, and the virus will run its course.  See below for common symptoms and more concerning symptoms.   Support your body with good nutrition and plenty of clear fluids.  Use tylenol and ibuprofen for pain, fever, aches, chills.   Get mucinex (can get mucinex DM) and take daily for the next 5-7 days.  This will help get mucus out of your chest to see if can cough it up.  It also tends to dry things up so you do need to push lots of fluids  If you have excessive sneezing and drainage you can try Benadryl for severe symptoms, this will help for 4 to 6 hours, if you find that you have the symptoms all day long you should start an over-the-counter antihistamine such as Zyrtec, Claritin, Allegra  For the chest tightness, you probably have a early bronchitis which is also a viral illness of your upper airways, this also usually runs its course on its own and is self resolving, typically lasts 1 to 6 weeks, coughing is usually the last symptom to resolve and it can be very annoying.  To decrease the tightness in her chest steroids will shrink the mucosal lining inside your airways and albuterol will relax the muscles holding her airways open, steroids to be used for the next 5 days, and breathing treatment used as needed when he feels tight, wheezy or shortness of breath.  See the handouts below.  If you do not feel improved over the next week please return to see [place], or we can order the chest x-ray to evaluate for any development of pneumonia, which is bacteria growing the lungs, blocking the air exchange, and needs to be treated with antibiotics.  I do not think that you have that at this time.  But viral infections can always lead to secondary bacterial infection such as sinusitis, pneumonia or ear infections.  Diagnosis Today:    Upper respiratory tract infection, unspecified type   Acute bronchitis, unspecified  organism  Meds ordered this encounter  Medications  . ipratropium-albuterol (DUONEB) 0.5-2.5 (3) MG/3ML nebulizer solution 3 mL  . predniSONE (DELTASONE) 20 MG tablet    Sig: Take 2 tablets (40 mg total) by mouth daily with breakfast for 5 days.    Dispense:  10 tablet    Refill:  0    Order Specific Question:   Supervising Provider    Answer:   Alycia Rossetti [[id number]]  . albuterol (VENTOLIN HFA) 108 (90 Base) MCG/ACT inhaler    Sig: Inhale 2 puffs into the lungs every 4 (four) hours as needed for wheezing or shortness of breath (or chest tightness).    Dispense:  1 Inhaler    Refill:  0    Order Specific Question:   Supervising Provider    Answer:   Alycia Rossetti [[id number]]  . benzonatate (TESSALON) 100 MG capsule    Sig: Take 1 capsule (100 mg total) by mouth 3 (three) times daily as needed for cough.    Dispense:  30 capsule    Refill:  0    Order Specific Question:   Supervising Provider    Answer:   Vic Blackbird F [[id number]]       Upper Respiratory Infection, Adult Most upper respiratory infections (URIs) are caused by a virus. A URI affects the nose, throat, and upper air passages. The most common type of URI is often called "the common  cold." Follow these instructions at home:  Take medicines only as told by your doctor.  Gargle warm saltwater or take cough drops to comfort your throat as told by your doctor.  Use a warm mist humidifier or inhale steam from a shower to increase air moisture. This may make it easier to breathe.  Drink enough fluid to keep your pee (urine) clear or pale yellow.  Eat soups and other clear broths.  Have a healthy diet.  Rest as needed.  Go back to work when your fever is gone or your doctor says it is okay. ? You may need to stay home longer to avoid giving your URI to others. ? You can also wear a face mask and wash your hands often to prevent spread of the virus.  Use your inhaler more if you have asthma.  Do not use any  tobacco products, including cigarettes, chewing tobacco, or electronic cigarettes. If you need help quitting, ask your doctor. Contact a doctor if:  You are getting worse, not better.  Your symptoms are not helped by medicine.  You have chills.  You are getting more short of breath.  You have brown or red mucus.  You have yellow or brown discharge from your nose.  You have pain in your face, especially when you bend forward.  You have a fever.  You have puffy (swollen) neck glands.  You have pain while swallowing.  You have white areas in the back of your throat. Get help right away if:  You have very bad or constant: ? Headache. ? Ear pain. ? Pain in your forehead, behind your eyes, and over your cheekbones (sinus pain). ? Chest pain.  You have long-lasting (chronic) lung disease and any of the following: ? Wheezing. ? Long-lasting cough. ? Coughing up blood. ? A change in your usual mucus.  You have a stiff neck.  You have changes in your: ? Vision. ? Hearing. ? Thinking. ? Mood. This information is not intended to replace advice given to you by your health care provider. Make sure you discuss any questions you have with your health care provider. Document Released: 07/23/2007 Document Revised: 10/07/2015 Document Reviewed: 05/11/2013 Elsevier Interactive Patient Education  2018 [employer].    Acute Bronchitis, Adult Acute bronchitis is sudden (acute) swelling of the air tubes (bronchi) in the lungs. Acute bronchitis causes these tubes to fill with mucus, which can make it hard to breathe. It can also cause coughing or wheezing. In adults, acute bronchitis usually goes away within 2 weeks. A cough caused by bronchitis may last up to 3 weeks. Smoking, allergies, and asthma can make the condition worse. Repeated episodes of bronchitis may cause further lung problems, such as chronic obstructive pulmonary disease (COPD). What are the causes? This condition can  be caused by germs and by substances that irritate the lungs, including:  Cold and flu viruses. This condition is most often caused by the same virus that causes a cold.  Bacteria.  Exposure to tobacco smoke, dust, fumes, and air pollution.  What increases the risk? This condition is more likely to develop in people who:  Have close contact with someone with acute bronchitis.  Are exposed to lung irritants, such as tobacco smoke, dust, fumes, and vapors.  Have a weak immune system.  Have a respiratory condition such as asthma.  What are the signs or symptoms? Symptoms of this condition include:  A cough.  Coughing up clear, yellow, or green mucus.  Wheezing.  Chest congestion.  Shortness of breath.  A fever.  Body aches.  Chills.  A sore throat.  How is this diagnosed? This condition is usually diagnosed with a physical exam. During the exam, your health care provider may order tests, such as chest X-rays, to rule out other conditions. He or she may also:  Test a sample of your mucus for bacterial infection.  Check the level of oxygen in your blood. This is done to check for pneumonia.  Do a chest X-ray or lung function testing to rule out pneumonia and other conditions.  Perform blood tests.  Your health care provider will also ask about your symptoms and medical history. How is this treated? Most cases of acute bronchitis clear up over time without treatment. Your health care provider may recommend:  Drinking more fluids. Drinking more makes your mucus thinner, which may make it easier to breathe.  Taking a medicine for a fever or cough.  Taking an antibiotic medicine.  Using an inhaler to help improve shortness of breath and to control a cough.  Using a cool mist vaporizer or humidifier to make it easier to breathe.  Follow these instructions at home: Medicines  Take over-the-counter and prescription medicines only as told by your health care  provider.  If you were prescribed an antibiotic, take it as told by your health care provider. Do not stop taking the antibiotic even if you start to feel better. General instructions  Get plenty of rest.  Drink enough fluids to keep your urine clear or pale yellow.  Avoid smoking and secondhand smoke. Exposure to cigarette smoke or irritating chemicals will make bronchitis worse. If you smoke and you need help quitting, ask your health care provider. Quitting smoking will help your lungs heal faster.  Use an inhaler, cool mist vaporizer, or humidifier as told by your health care provider.  Keep all follow-up visits as told by your health care provider. This is important. How is this prevented? To lower your risk of getting this condition again:  Wash your hands often with soap and water. If soap and water are not available, use hand sanitizer.  Avoid contact with people who have cold symptoms.  Try not to touch your hands to your mouth, nose, or eyes.  Make sure to get the flu shot every year.  Contact a health care provider if:  Your symptoms do not improve in 2 weeks of treatment. Get help right away if:  You cough up blood.  You have chest pain.  You have severe shortness of breath.  You become dehydrated.  You faint or keep feeling like you are going to faint.  You keep vomiting.  You have a severe headache.  Your fever or chills gets worse. This information is not intended to replace advice given to you by your health care provider. Make sure you discuss any questions you have with your health care provider. Document Released: 03/13/2004 Document Revised: 08/29/2015 Document Reviewed: 07/25/2015 Elsevier Interactive Patient Education  [employer].

## 2017-06-26 NOTE — Progress Notes (Signed)
[name], female    DOB: September 18, 1950, 67 y.o.   MRN: [id number]  PCP: Alycia Rossetti, MD  Chief Complaint  Patient presents with  . Sore Throat    Patient in today with c/o fever, sore throat, sinus drainage, muscle aches. Onset tuesday.    Subjective:   Rebecca Howe is a 66 y.o. female, presents to clinic with CC of 4 days of URI sx, started with sore throat x 1 d, then profuse nasal drainage and sneezing x 2 days, and today continued nasal drainage and new cough.  Cough is severe, nonproductive, described as dry, associated with chest tightness, unchanged since its onset 1 day ago, no treatments attempted, no alleviating factors, slightly aggravated with laying down or when she has increased nasal drainage or postnasal drip.  She has associated fever and chills, body aches, fatigue, muscle aches, raw nose and decreased appetite.  Normal activities such as walking over to the neighbors house makes her extremely tired, and she has been resting more, but she denies weakness, exertional SOB, CP, wheeze.  No palpitations, orthopnea, PND, lower extremity edema, rash, back or chest pain, abdominal pain, nausea, vomiting, diarrhea.   No sick contacts at home, but she believes she may have exposures to sick people at church.  She does not have a history of seasonal allergies and her sneezing and drainage have seemed to stop.  She did try Zyrtec once and it did not improve her symptoms.  She took Tylenol this morning prior to coming in and to help with her fever and chills.  T-max at home was 101. She has no history of lung disease, asthma or COPD, no smoking history.   Patient Active Problem List   Diagnosis Date Noted  . Fibroma 11/26/2016  . Mass 11/03/2016  . Vitamin D deficiency 05/06/2013  . Hyperlipidemia 02/04/2013     Prior to Admission medications   Medication Sig Start Date End Date Taking? Authorizing Provider  acetaminophen (TYLENOL) 500 MG tablet Take 500  mg by mouth every 6 (six) hours as needed.   Yes [provider]  cholecalciferol (VITAMIN D) 1000 UNITS tablet Take 1,000 Units by mouth daily. 2 tabs Daily   Yes [provider]  diazepam (VALIUM) 5 MG tablet Take 1 tablet 1 hour before dental procedure 01/20/17  Yes Bliss, Modena Nunnery, MD  ibuprofen (ADVIL,MOTRIN) 200 MG tablet Take 200 mg by mouth every 6 (six) hours as needed.   Yes [provider]  Lactobacillus (PROBIOTIC ACIDOPHILUS PO) Take by mouth.   Yes [provider]  Multiple Vitamin (MULTIVITAMIN) tablet Take 1 tablet by mouth daily.   Yes [provider]  TURMERIC PO Take 1,500 mg by mouth.   Yes [provider]     No Known Allergies   Family History  Problem Relation Age of Onset  . Hypertension Father   . Diabetes Paternal Grandmother   . Hypertension Mother   . Thyroid disease Mother   . Hypertension Brother   . Cancer Paternal Aunt 37       colon cancer  . Breast cancer Paternal Aunt      Social History   Socioeconomic History  . Marital status: Married    Spouse name: Not on file  . Number of children: Not on file  . Years of education: Not on file  . Highest education level: Not on file  Occupational History  . Not on file  Social Needs  .  Financial resource strain: Not on file  . Food insecurity:    Worry: Not on file    Inability: Not on file  . Transportation needs:    Medical: Not on file    Non-medical: Not on file  Tobacco Use  . Smoking status: Never Smoker  . Smokeless tobacco: Never Used  Substance and Sexual Activity  . Alcohol use: No  . Drug use: No  . Sexual activity: Yes    Birth control/protection: Surgical    Comment: BTL  Lifestyle  . Physical activity:    Days per week: Not on file    Minutes per session: Not on file  . Stress: Not on file  Relationships  . Social connections:    Talks on phone: Not on file    Gets together: Not on file    Attends religious  service: Not on file    Active member of club or organization: Not on file    Attends meetings of clubs or organizations: Not on file    Relationship status: Not on file  . Intimate partner violence:    Fear of current or ex partner: Not on file    Emotionally abused: Not on file    Physically abused: Not on file    Forced sexual activity: Not on file  Other Topics Concern  . Not on file  Social History Narrative   ** Merged History Encounter **         Review of Systems  Constitutional: Positive for activity change, appetite change, chills, fatigue and fever. Negative for diaphoresis and unexpected weight change.  HENT: Positive for congestion, postnasal drip, rhinorrhea, sneezing and sore throat. Negative for sinus pressure, sinus pain, tinnitus and trouble swallowing.   Eyes: Positive for discharge (watery). Negative for photophobia, pain, redness, itching and visual disturbance.  Respiratory: Positive for cough and chest tightness. Negative for apnea, choking, shortness of breath, wheezing and stridor.   Cardiovascular: Negative.  Negative for chest pain, palpitations and leg swelling.  Gastrointestinal: Negative.  Negative for abdominal pain, constipation, diarrhea, nausea and vomiting.  Endocrine: Negative.   Genitourinary: Negative.   Musculoskeletal: Negative.  Negative for arthralgias, back pain, gait problem, joint swelling, neck pain and neck stiffness.  Skin: Negative.  Negative for color change, pallor and rash.  Allergic/Immunologic: Negative.   Neurological: Positive for headaches. Negative for dizziness, syncope, facial asymmetry, weakness, light-headedness and numbness.  Hematological: Negative.   Psychiatric/Behavioral: Negative.        Objective:    Vitals:   06/26/17 0919  BP: 130/84  Pulse: 95  Temp: 99.1 F (37.3 C)  TempSrc: Oral  SpO2: 95%  Weight: 175 lb (79.4 kg)  Height: 5' 7.5" (1.715 m)      Physical Exam  Constitutional: She is  oriented to person, place, and time. She appears well-developed and well-nourished.  Non-toxic appearance. She does not appear ill. No distress.  HENT:  Head: Normocephalic and atraumatic.  Right Ear: Tympanic membrane, external ear and ear canal normal. No drainage, swelling or tenderness. No middle ear effusion.  Left Ear: Tympanic membrane, external ear and ear canal normal. No drainage, swelling or tenderness.  No middle ear effusion.  Nose: Right sinus exhibits no maxillary sinus tenderness and no frontal sinus tenderness. Left sinus exhibits no maxillary sinus tenderness and no frontal sinus tenderness.  Mouth/Throat: Uvula is midline and mucous membranes are normal. Mucous membranes are not pale, not dry and not cyanotic. No oral lesions. No uvula swelling. Posterior  oropharyngeal erythema (mild) present. No oropharyngeal exudate, posterior oropharyngeal edema or tonsillar abscesses. Tonsils are 1+ on the right. Tonsils are 1+ on the left. No tonsillar exudate.  Hearing aids in bilateral ears, removed for exam Nasal mucosa mildly edematous and erythematous with moderate clear discharge, turbinates mildly enlarged Posterior oropharynx with diffuse injection, no exudate, no edema  Eyes: Pupils are equal, round, and reactive to light. Conjunctivae, EOM and lids are normal.  Neck: Normal range of motion and phonation normal. Neck supple. No tracheal deviation present.  Cardiovascular: Normal rate, regular rhythm, normal heart sounds, intact distal pulses and normal pulses. Exam reveals no gallop and no friction rub.  No murmur heard. Pulses:      Radial pulses are 2+ on the right side, and 2+ on the left side.       Posterior tibial pulses are 2+ on the right side, and 2+ on the left side.  Pulmonary/Chest: Effort normal and breath sounds normal. No stridor. No tachypnea. No respiratory distress. She has no decreased breath sounds. She has no wheezes. She has no rhonchi. She has no rales. She  exhibits no tenderness.  Abdominal: Soft. Normal appearance and bowel sounds are normal. She exhibits no distension. There is no tenderness. There is no rebound and no guarding.  Musculoskeletal: Normal range of motion. She exhibits no edema or deformity.  Lymphadenopathy:    She has no cervical adenopathy.  Neurological: She is alert and oriented to person, place, and time. She exhibits normal muscle tone. Gait normal.  Skin: Skin is warm, dry and intact. Capillary refill takes less than 2 seconds. No rash noted. She is not diaphoretic. No pallor.  Psychiatric: Her speech is normal and behavior is normal. Thought content normal. Her mood appears anxious. Cognition and memory are normal. She does not exhibit a depressed mood.  Nursing note and vitals reviewed.   Results for orders placed or performed in visit on 06/26/17 (from the past 24 hour(s))  Influenza A and B Ag, Immunoassay     Status: None   Collection Time: 06/26/17  9:49 AM  Result Value Ref Range   Source: NASOPHARYNX    INFLUENZA A ANTIGEN NOT DETECTED NOT DETECT   INFLUENZA B ANTIGEN NOT DETECTED NOT DETECT   COMMENT:    STREP GROUP A AG, W/REFLEX TO CULT     Status: None   Collection Time: 06/26/17  9:49 AM  Result Value Ref Range   Streptococcus, Group A Screen (Direct) NONE DETECTED         Assessment & Plan:      ICD-10-CM   1. Upper respiratory tract infection, unspecified type J06.9 Influenza A and B Ag, Immunoassay    STREP GROUP A AG, W/REFLEX TO CULT    ipratropium-albuterol (DUONEB) 0.5-2.5 (3) MG/3ML nebulizer solution 3 mL    Culture, Group A Strep  2. Acute bronchitis, unspecified organism J20.9 ipratropium-albuterol (DUONEB) 0.5-2.5 (3) MG/3ML nebulizer solution 3 mL    predniSONE (DELTASONE) 20 MG tablet    albuterol (VENTOLIN HFA) 108 (90 Base) MCG/ACT inhaler    benzonatate (TESSALON) 100 MG capsule     Patient with 3 days of URI symptoms that have been severe, began with sore throat which mostly  resolved, 2 days of profuse and severe nasal drainage and sneezing, then today settled into her chest she has chest tightness and nonproductive cough feels very fatigued with minor activity, no tachypnea, exertional shortness of breath, wheeze, no concerning cardiac symptoms or involvement.  She  was given a breathing treatment in clinic and reevaluated.  She has good breath sounds clear in all lung fields anteriorly and posteriorly.  Believe she has URI and secondary bronchitis, explained to her it is likely viral in nature, supportive treatment, Tylenol ibuprofen for pain and fever, Mucinex for chest congestion and chest tightness, prescribed short steroid burst and albuterol, and somewhat concerned the patient may not tolerate this because she seems very sensitive to procedures medications and is slightly anxious here today.  I did offer to do a chest x-ray, but patient refuses.  She is really evaluated after breathing treatment, she did not note any change to the symptoms in her chest, lungs continue to be clear to auscultation anteriorly and posteriorly, pulse oximetry repeated 10 to 15 minutes after breathing treatment continue to be 96 to 98%, heart rate in the low 90s, patient had no distress, no tachypnea, no increased work of breathing.  Printed for her and reviewed her discharge paperwork, do feel as viral illness, likely self resolving.  She will not pick up the albuterol inhaler at this time because of breathing treatment did not change her symptoms much but she will start the steroid burst, get over-the-counter cough medicine, use Tylenol ibuprofen, and get Mucinex.    She was instructed to call us or come be reevaluated if she has acute worsening of sinus pain with fever, or new inspiratory chest pain, with worsening cough, shortness of breath, fever, night sweats or fatigue.    Delsa Grana, PA-C 06/26/17 10:53 AM

## 2017-06-28 LAB — CULTURE, GROUP A STREP
MICRO NUMBER: 90572791
SPECIMEN QUALITY: ADEQUATE

## 2017-06-28 LAB — STREP GROUP A AG, W/REFLEX TO CULT: Streptococcus, Group A Screen (Direct): NOT DETECTED

## 2017-06-29 NOTE — Progress Notes (Signed)
Negative throat culture - patient will be notified

## 2017-07-10 ENCOUNTER — Ambulatory Visit (INDEPENDENT_AMBULATORY_CARE_PROVIDER_SITE_OTHER): Payer: Medicare HMO | Admitting: Family Medicine

## 2017-07-10 ENCOUNTER — Other Ambulatory Visit: Payer: Self-pay

## 2017-07-10 ENCOUNTER — Encounter: Payer: Self-pay | Admitting: Family Medicine

## 2017-07-10 ENCOUNTER — Ambulatory Visit
Admission: RE | Admit: 2017-07-10 | Discharge: 2017-07-10 | Disposition: A | Discharge status: Home / Self Care | Payer: Medicare HMO | Source: Ambulatory Visit | Attending: Family Medicine | Admitting: Family Medicine

## 2017-07-10 VITALS — BP 120/76 | HR 84 | Temp 98.3°F | Resp 18 | Ht 67.5 in | Wt 175.5 lb

## 2017-07-10 DIAGNOSIS — J209 Acute bronchitis, unspecified: Secondary | ICD-10-CM

## 2017-07-10 DIAGNOSIS — R0789 Other chest pain: Secondary | ICD-10-CM | POA: Diagnosis not present

## 2017-07-10 DIAGNOSIS — R05 Cough: Secondary | ICD-10-CM | POA: Diagnosis not present

## 2017-07-10 MED ORDER — IPRATROPIUM-ALBUTEROL 0.5-2.5 (3) MG/3ML IN SOLN
3.0000 mL | Freq: Once | RESPIRATORY_TRACT | Status: AC
  Administered 2017-07-10: 3 mL via RESPIRATORY_TRACT

## 2017-07-10 MED ORDER — ALBUTEROL SULFATE HFA 108 (90 BASE) MCG/ACT IN AERS: 2.0000 | INHALATION_SPRAY | RESPIRATORY_TRACT | 0 refills | Status: DC | PRN

## 2017-07-10 MED ORDER — PREDNISONE 20 MG PO TABS: 40.0000 mg | ORAL_TABLET | Freq: Every day | ORAL | 0 refills | Status: AC

## 2017-07-10 NOTE — Progress Notes (Signed)
Xray negative - we will treat the bronchitis again.  Please call us it not improving.  Bronchitis is viral and usually takes 1-2 weeks to resolve, but you should feel better with your breathing with the medications in the next 3-5 days.    Do not hesitate to call afterhours if worsening, or for severe CP or shortness of breath go to the ER.

## 2017-07-10 NOTE — Progress Notes (Signed)
[name], female    DOB: [birth year], 67 y.o.   MRN: [id number]  PCP: Alycia Rossetti, MD  Chief Complaint  Patient presents with  . Cough    Subjective:   Rebecca Howe is a 67 y.o. female, presents to clinic with chief complaint of returning cough with chest tightness, fatigue and subjective low-grade fever, onset in the past 2 to 3 days.  Cough is nonproductive, she denies chills, sweats.  She has some scratchy throat and chest congestion she denies any nasal congestion or sore throat.  She was ill with similar symptoms but slightly worse 2 weeks ago and she improved and felt "100%" they went on a vacation, driving to [place] in [place], and returning past couple days she felt ill again.  She denies any allergy symptoms while traveling or returning to [place]. Denies lower extremity edema, unilateral leg swelling, chest pain or pressure, orthopnea, palpitations, near-syncope   Patient Active Problem List   Diagnosis Date Noted  . Fibroma 11/26/2016  . Mass 11/03/2016  . Vitamin D deficiency 05/06/2013  . Hyperlipidemia 02/04/2013     Prior to Admission medications   Medication Sig Start Date End Date Taking? Authorizing Provider  acetaminophen (TYLENOL) 500 MG tablet Take 500 mg by mouth every 6 (six) hours as needed.   Yes [provider]  cholecalciferol (VITAMIN D) 1000 UNITS tablet Take 1,000 Units by mouth daily. 2 tabs Daily   Yes [provider]  Lactobacillus (PROBIOTIC ACIDOPHILUS PO) Take by mouth.   Yes [provider]  Multiple Vitamin (MULTIVITAMIN) tablet Take 1 tablet by mouth daily.   Yes [provider]  TURMERIC PO Take 1,500 mg by mouth.   Yes [provider]  albuterol (VENTOLIN HFA) 108 (90 Base) MCG/ACT inhaler Inhale 2 puffs into the lungs every 4 (four) hours as needed for wheezing or shortness of breath (or chest tightness). Patient not taking: Reported on 07/10/2017 06/26/17   Delsa Grana, PA-C  benzonatate (TESSALON) 100 MG capsule Take 1 capsule (100 mg total) by mouth 3 (three) times daily as needed for cough. Patient not taking: Reported on 07/10/2017 06/26/17   Delsa Grana, PA-C  diazepam (VALIUM) 5 MG tablet Take 1 tablet 1 hour before dental procedure Patient not taking: Reported on 07/10/2017 01/20/17   Alycia Rossetti, MD  ibuprofen (ADVIL,MOTRIN) 200 MG tablet Take 200 mg by mouth every 6 (six) hours as needed.    [provider]     No Known Allergies   Family History  Problem Relation Age of Onset  . Hypertension Father   . Diabetes Paternal Grandmother   . Hypertension Mother   . Thyroid disease Mother   . Hypertension Brother   . Cancer Paternal Aunt 80       colon cancer  . Breast cancer Paternal Aunt      Social History   Socioeconomic History  . Marital status: Married    Spouse name: Not on file  . Number of children: Not on file  . Years of education: Not on file  . Highest education level: Not on file  Occupational History  . Not on file  Social Needs  . Financial resource strain: Not on file  . Food insecurity:    Worry: Not on file    Inability: Not on file  . Transportation needs:    Medical: Not on file    Non-medical: Not on file  Tobacco Use  .  Smoking status: Never Smoker  . Smokeless tobacco: Never Used  Substance and Sexual Activity  . Alcohol use: No  . Drug use: No  . Sexual activity: Yes    Birth control/protection: Surgical    Comment: BTL  Lifestyle  . Physical activity:    Days per week: Not on file    Minutes per session: Not on file  . Stress: Not on file  Relationships  . Social connections:    Talks on phone: Not on file    Gets together: Not on file    Attends religious service: Not on file    Active member of club or organization: Not on file    Attends meetings of clubs or organizations: Not on file    Relationship status: Not on file  . Intimate partner violence:    Fear of  current or ex partner: Not on file    Emotionally abused: Not on file    Physically abused: Not on file    Forced sexual activity: Not on file  Other Topics Concern  . Not on file  Social History Narrative   ** Merged History Encounter **         Review of Systems  Constitutional: Negative.   HENT: Negative.   Eyes: Negative.   Respiratory: Positive for chest tightness.   Cardiovascular: Negative.   Gastrointestinal: Negative.   Endocrine: Negative.   Genitourinary: Negative.   Musculoskeletal: Negative.   Skin: Negative.   Neurological: Negative for dizziness.  Hematological: Negative.   Psychiatric/Behavioral: Negative.   All other systems reviewed and are negative.      Objective:    Vitals:   07/10/17 1004  BP: 120/76  Pulse: 84  Temp: 98.3 F (36.8 C)  TempSrc: Oral  SpO2: 99%  Weight: 175 lb 8 oz (79.6 kg)      Physical Exam  Constitutional: She is oriented to person, place, and time. She appears well-developed and well-nourished.  Non-toxic appearance. No distress. She is not intubated.  HENT:  Head: Normocephalic and atraumatic.  Right Ear: External ear normal.  Left Ear: External ear normal.  Nose: Nose normal.  Mouth/Throat: Uvula is midline, oropharynx is clear and moist and mucous membranes are normal.  Eyes: Pupils are equal, round, and reactive to light. Conjunctivae, EOM and lids are normal.  Neck: Normal range of motion and phonation normal. Neck supple. No tracheal deviation present.  Cardiovascular: Normal rate, regular rhythm, normal heart sounds and normal pulses. Exam reveals no gallop and no friction rub.  No murmur heard. Pulses:      Radial pulses are 2+ on the right side, and 2+ on the left side.       Posterior tibial pulses are 2+ on the right side, and 2+ on the left side.  Pulmonary/Chest: Effort normal and breath sounds normal. No accessory muscle usage or stridor. No tachypnea. She is not intubated. No respiratory distress.  She has no wheezes. She has no rhonchi. She has no rales. She exhibits no tenderness.  Abdominal: Soft. Normal appearance and bowel sounds are normal. She exhibits no distension. There is no tenderness. There is no rebound and no guarding.  Musculoskeletal: Normal range of motion. She exhibits no edema or deformity.  Lymphadenopathy:    She has no cervical adenopathy.  Neurological: She is alert and oriented to person, place, and time. She exhibits normal muscle tone. Gait normal.  Skin: Skin is warm, dry and intact. Capillary refill takes less than 2 seconds. No  rash noted. She is not diaphoretic. No pallor.  Psychiatric: She has a normal mood and affect. Her speech is normal and behavior is normal.  Nursing note and vitals reviewed.         Assessment & Plan:      ICD-10-CM   1. Acute bronchitis, unspecified organism J20.9 predniSONE (DELTASONE) 20 MG tablet    albuterol (PROVENTIL HFA;VENTOLIN HFA) 108 (90 Base) MCG/ACT inhaler    ipratropium-albuterol (DUONEB) 0.5-2.5 (3) MG/3ML nebulizer solution 3 mL  2. Chest tightness R07.89 DG Chest 2 View    predniSONE (DELTASONE) 20 MG tablet    albuterol (PROVENTIL HFA;VENTOLIN HFA) 108 (90 Base) MCG/ACT inhaler    ipratropium-albuterol (DUONEB) 0.5-2.5 (3) MG/3ML nebulizer solution 3 mL     Suspect Acute bronchitis, with recurrent illness will obtain CXR- Pt seen also by Dr. Buelah Manis agrees with plan, please see her documentation.  Pt reevaluated after duoneb tx, had faint exp wheeze, no retractions, appeared slightly anxious.  HR 84, SpO2 95%, RR 20.   Delsa Grana, PA-C 07/10/17 10:38 AM

## 2017-07-10 NOTE — Progress Notes (Signed)
Patient in today with c/o chest tightness and chest congestion, fever, and fatigue. States was seen 2 weeks ago for URI and symptoms resolved but have since returned in the past week.

## 2017-07-29 DIAGNOSIS — L570 Actinic keratosis: Secondary | ICD-10-CM | POA: Diagnosis not present

## 2017-07-29 DIAGNOSIS — L814 Other melanin hyperpigmentation: Secondary | ICD-10-CM | POA: Diagnosis not present

## 2017-07-29 DIAGNOSIS — L309 Dermatitis, unspecified: Secondary | ICD-10-CM | POA: Diagnosis not present

## 2017-07-29 DIAGNOSIS — L821 Other seborrheic keratosis: Secondary | ICD-10-CM | POA: Diagnosis not present

## 2017-08-05 DIAGNOSIS — H52221 Regular astigmatism, right eye: Secondary | ICD-10-CM | POA: Diagnosis not present

## 2017-08-05 DIAGNOSIS — H524 Presbyopia: Secondary | ICD-10-CM | POA: Diagnosis not present

## 2017-08-05 DIAGNOSIS — H5202 Hypermetropia, left eye: Secondary | ICD-10-CM | POA: Diagnosis not present

## 2017-08-05 DIAGNOSIS — H5201 Hypermetropia, right eye: Secondary | ICD-10-CM | POA: Diagnosis not present

## 2017-08-07 DIAGNOSIS — R69 Illness, unspecified: Secondary | ICD-10-CM | POA: Diagnosis not present

## 2017-08-11 DIAGNOSIS — Z0100 Encounter for examination of eyes and vision without abnormal findings: Secondary | ICD-10-CM | POA: Diagnosis not present

## 2017-09-03 DIAGNOSIS — Z780 Asymptomatic menopausal state: Secondary | ICD-10-CM | POA: Diagnosis not present

## 2017-09-03 DIAGNOSIS — Z1231 Encounter for screening mammogram for malignant neoplasm of breast: Secondary | ICD-10-CM | POA: Diagnosis not present

## 2017-09-03 DIAGNOSIS — M85851 Other specified disorders of bone density and structure, right thigh: Secondary | ICD-10-CM | POA: Diagnosis not present

## 2017-09-03 LAB — HM DEXA SCAN

## 2017-09-03 LAB — HM MAMMOGRAPHY

## 2017-09-04 ENCOUNTER — Encounter: Payer: Self-pay | Admitting: *Deleted

## 2017-09-04 ENCOUNTER — Encounter: Payer: Self-pay | Admitting: Family Medicine

## 2017-09-04 DIAGNOSIS — M8580 Other specified disorders of bone density and structure, unspecified site: Secondary | ICD-10-CM | POA: Insufficient documentation

## 2017-09-07 ENCOUNTER — Encounter: Payer: Self-pay | Admitting: *Deleted

## 2017-09-08 ENCOUNTER — Encounter: Payer: Self-pay | Admitting: Certified Nurse Midwife

## 2017-09-10 ENCOUNTER — Telehealth: Payer: Self-pay

## 2017-09-10 NOTE — Telephone Encounter (Signed)
lmtcb regarding bmd results. See paper results.

## 2017-09-10 NOTE — Telephone Encounter (Signed)
Patient returning call to Joy.  

## 2017-09-10 NOTE — Telephone Encounter (Signed)
Patient notified of results.

## 2017-09-16 ENCOUNTER — Encounter: Payer: Self-pay | Admitting: *Deleted

## 2017-10-01 DIAGNOSIS — H903 Sensorineural hearing loss, bilateral: Secondary | ICD-10-CM | POA: Diagnosis not present

## 2017-10-01 DIAGNOSIS — Z822 Family history of deafness and hearing loss: Secondary | ICD-10-CM | POA: Diagnosis not present

## 2017-11-11 ENCOUNTER — Encounter: Payer: Self-pay | Admitting: Family Medicine

## 2017-11-11 ENCOUNTER — Ambulatory Visit (INDEPENDENT_AMBULATORY_CARE_PROVIDER_SITE_OTHER): Payer: Medicare HMO | Admitting: Family Medicine

## 2017-11-11 ENCOUNTER — Other Ambulatory Visit: Payer: Self-pay

## 2017-11-11 VITALS — BP 136/80 | HR 84 | Temp 98.6°F | Resp 12 | Ht 67.5 in | Wt 175.0 lb

## 2017-11-11 DIAGNOSIS — L989 Disorder of the skin and subcutaneous tissue, unspecified: Secondary | ICD-10-CM
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
## 2017-11-11 NOTE — Patient Instructions (Addendum)
  POST CARE TREATMENT FOR CRYOTHERAPY/LIQUID NITROGEN FREEZING TREATMENTS  Warts, pre-cancers or other skin lesions treated with Cyrotherapy: Swelling, redness and blistering may occur 2-24 hours after treatment. Vaseline (using a Q-tip) may be applied to the treated area(s) for a couple of days  Some fluid or blood-filled blisters will form 24-48 hours after freezing. If you experience pain or discomfort you may take Tylenol/Advil. If the blister breaks: Clean the area with soap and water Apply Vaseline (with a Q-tip). The blister will dry up over the next few days  If the area(s) become very red, painful to touch, and/or looks Infected contact the office  Scabbing and peeling (removing partial or total area) will occur 1-2 weeks after treatment. Healing generally take 7-14 days with little or no scarring ALWAYS wash hands before and after treating the area Multiple treatments may be needed

## 2017-11-27 ENCOUNTER — Ambulatory Visit (INDEPENDENT_AMBULATORY_CARE_PROVIDER_SITE_OTHER): Payer: Medicare HMO

## 2017-11-27 DIAGNOSIS — C44622 Squamous cell carcinoma of skin of right upper limb, including shoulder: Secondary | ICD-10-CM | POA: Diagnosis not present

## 2017-11-27 DIAGNOSIS — D485 Neoplasm of uncertain behavior of skin: Secondary | ICD-10-CM | POA: Diagnosis not present

## 2017-11-27 DIAGNOSIS — Z23 Encounter for immunization: Secondary | ICD-10-CM

## 2017-11-27 NOTE — Progress Notes (Signed)
Patient was in office for high dose flu vaccine.Patient vaccine in her left deltoid.Patient tolerated well

## 2018-01-22 ENCOUNTER — Encounter: Payer: Medicare HMO | Admitting: Family Medicine

## 2018-02-23 DIAGNOSIS — R69 Illness, unspecified: Secondary | ICD-10-CM | POA: Diagnosis not present

## 2018-03-23 ENCOUNTER — Ambulatory Visit (INDEPENDENT_AMBULATORY_CARE_PROVIDER_SITE_OTHER): Payer: Medicare HMO | Admitting: Family Medicine

## 2018-03-23 ENCOUNTER — Encounter: Payer: Self-pay | Admitting: Family Medicine

## 2018-03-23 ENCOUNTER — Other Ambulatory Visit: Payer: Self-pay

## 2018-03-23 VITALS — BP 118/74 | HR 78 | Temp 98.6°F | Resp 14 | Ht 67.5 in | Wt 176.0 lb

## 2018-03-23 DIAGNOSIS — H9193 Unspecified hearing loss, bilateral: Secondary | ICD-10-CM

## 2018-03-23 DIAGNOSIS — E559 Vitamin D deficiency, unspecified: Secondary | ICD-10-CM

## 2018-03-23 DIAGNOSIS — Z0000 Encounter for general adult medical examination without abnormal findings: Secondary | ICD-10-CM

## 2018-03-23 DIAGNOSIS — Z23 Encounter for immunization: Secondary | ICD-10-CM

## 2018-03-23 DIAGNOSIS — E7800 Pure hypercholesterolemia, unspecified: Secondary | ICD-10-CM

## 2018-03-23 DIAGNOSIS — I700 Atherosclerosis of aorta: Secondary | ICD-10-CM | POA: Diagnosis not present

## 2018-03-23 DIAGNOSIS — M8589 Other specified disorders of bone density and structure, multiple sites: Secondary | ICD-10-CM

## 2018-03-23 NOTE — Progress Notes (Signed)
Subjective:   Patient presents for Medicare Annual/Subsequent preventive examination.   No specific concerns Did see dermatology last year has squamous cell carcinoma removed from her right forearm  Has history of hyperlipidemia had some mild atherosclerosis on her ultrasound of the aorta 2 years ago.  She declined statin drug therapy at that time. Review Past Medical/Family/Social: Per EMR    Risk Factors  Current exercise habits: Walks almost daily Dietary issues discussed: No specific concerns  Cardiac risk factors: Hyperlipidemia   Depression Screen  (Note: if answer to either of the following is "Yes", a more complete depression screening is indicated)  Over the past two weeks, have you felt down, depressed or hopeless? No Over the past two weeks, have you felt little interest or pleasure in doing things? No Have you lost interest or pleasure in daily life? No Do you often feel hopeless? No Do you cry easily over simple problems? No   Activities of Daily Living  In your present state of health, do you have any difficulty performing the following activities?:  Driving? No  Managing money? No  Feeding yourself? No  Getting from bed to chair? No  Climbing a flight of stairs? No  Preparing food and eating?: No  Bathing or showering? No  Getting dressed: No  Getting to the toilet? No  Using the toilet:No  Moving around from place to place: No  In the past year have you fallen or had a near fall?:No  Are you sexually active? No  Do you have more than one partner? No   Hearing Difficulties: No  Do you often ask people to speak up or repeat themselves? No  Do you experience ringing or noises in your ears? No Do you have difficulty understanding soft or whispered voices? No  Do you feel that you have a problem with memory? No Do you often misplace items? No  Do you feel safe at home? Yes  Cognitive Testing  Alert? Yes Normal Appearance?Yes  Oriented to person? Yes Place?  Yes  Time? Yes  Recall of three objects? Yes  Can perform simple calculations? Yes  Displays appropriate judgment?Yes  Can read the correct time from a watch face?Yes   LList the Names of Other Physician/Practitioners you currently use:   GYN  Audiology  [facility]- Dr. Ubaldo Glassing  Screening Tests / Date UTD Colonoscopy    UTD              Bone density up-to-date-osteopenia Shingrix-up-to-date Mammogram  UTD Influenza Vaccine  UTD Tetanus/tdap UTD Pneumonia- Due for Pneumovax 23  ROS:  GEN- denies fatigue, fever, weight loss,weakness, recent illness HEENT- denies eye drainage, change in vision, nasal discharge, CVS- denies chest pain, palpitations RESP- denies SOB, cough, wheeze ABD- denies N/V, change in stools, abd pain GU- denies dysuria, hematuria, dribbling, incontinence MSK- denies  joint pain, muscle aches, injury Neuro- denies headache, dizziness, syncope, seizure activity  PHYSICAL: Vitals reviewed GEN- NAD, alert and oriented x3 HEENT- PERRL, EOMI, non injected sclera, pink conjunctiva, MMM, oropharynx clear hearing aids Neck- Supple, no thryomegaly, no bruit  CVS- RRR, no murmur RESP-CTAB ABD-NABS,soft,NT,ND EXT- No edema Pulses- Radial, DP- 2+   Assessment:    Annual wellness medicare exam   Plan:    During the course of the visit the patient was educated and counseled about appropriate screening and preventive services including:  FALL/DEPRESSION/AUDIT C Screening- Negative  Aortic atherosclerosis/Hyperlipidemia- recheck lipids, monitoring diet Pneumonia vaccine given, otherwise immunizations UTD No significant urinary problems  Osteopenia- weight bearing exercise, calcium and vitamin D   Hearing impaired followed by audiology   Pt has advanced directives- Full Code     Diet review for nutrition referral? Yes ____ Not Indicated __x__  Patient Instructions (the written plan) was given to the patient.  Medicare Attestation  I have  personally reviewed:  The patient's medical and social history  Their use of alcohol, tobacco or illicit drugs  Their current medications and supplements  The patient's functional ability including ADLs,fall risks, home safety risks, cognitive, and hearing and visual impairment  Diet and physical activities  Evidence for depression or mood disorders  The patient's weight, height, BMI, and visual acuity have been recorded in the chart. I have made referrals, counseling, and provided education to the patient based on review of the above and I have provided the patient with a written personalized care plan for preventive services.

## 2018-03-23 NOTE — Patient Instructions (Signed)
I recommend eye visit once a year I recommend dental visit every 6 months Goal is to  Exercise 30 minutes 5 days a week We will  Call with lab results  F/U 1 year

## 2018-03-23 NOTE — Addendum Note (Signed)
Addended by: Sheral Flow on: 03/23/2018 09:05 AM   Modules accepted: Orders

## 2018-03-24 LAB — COMPREHENSIVE METABOLIC PANEL
AG RATIO: 1.7 (calc) (ref 1.0–2.5)
ALT: 15 U/L (ref 6–29)
AST: 25 U/L (ref 10–35)
Albumin: 4.1 g/dL (ref 3.6–5.1)
Alkaline phosphatase (APISO): 78 U/L (ref 37–153)
BUN: 13 mg/dL (ref 7–25)
CO2: 28 mmol/L (ref 20–32)
CREATININE: 0.9 mg/dL (ref 0.50–0.99)
Calcium: 9.5 mg/dL (ref 8.6–10.4)
Chloride: 104 mmol/L (ref 98–110)
GLUCOSE: 89 mg/dL (ref 65–99)
Globulin: 2.4 g/dL (calc) (ref 1.9–3.7)
Potassium: 4.7 mmol/L (ref 3.5–5.3)
SODIUM: 141 mmol/L (ref 135–146)
TOTAL PROTEIN: 6.5 g/dL (ref 6.1–8.1)
Total Bilirubin: 0.4 mg/dL (ref 0.2–1.2)

## 2018-03-24 LAB — VITAMIN D 25 HYDROXY (VIT D DEFICIENCY, FRACTURES): VIT D 25 HYDROXY: 36 ng/mL (ref 30–100)

## 2018-03-24 LAB — CBC WITH DIFFERENTIAL/PLATELET
Absolute Monocytes: 392 cells/uL (ref 200–950)
BASOS ABS: 29 {cells}/uL (ref 0–200)
BASOS PCT: 0.6 %
EOS PCT: 1.4 %
Eosinophils Absolute: 69 cells/uL (ref 15–500)
HCT: 38.3 % (ref 35.0–45.0)
Hemoglobin: 12.8 g/dL (ref 11.7–15.5)
Lymphs Abs: 1539 cells/uL (ref 850–3900)
MCH: 29.5 pg (ref 27.0–33.0)
MCHC: 33.4 g/dL (ref 32.0–36.0)
MCV: 88.2 fL (ref 80.0–100.0)
MONOS PCT: 8 %
MPV: 10.8 fL (ref 7.5–12.5)
NEUTROS ABS: 2871 {cells}/uL (ref 1500–7800)
Neutrophils Relative %: 58.6 %
PLATELETS: 226 10*3/uL (ref 140–400)
RBC: 4.34 10*6/uL (ref 3.80–5.10)
RDW: 12.6 % (ref 11.0–15.0)
TOTAL LYMPHOCYTE: 31.4 %
WBC: 4.9 10*3/uL (ref 3.8–10.8)

## 2018-03-24 LAB — LIPID PANEL
CHOL/HDL RATIO: 3.9 (calc) (ref <5.0)
Cholesterol: 209 mg/dL — ABNORMAL HIGH (ref <200)
HDL: 54 mg/dL (ref >=50)
LDL CHOLESTEROL (CALC): 138 mg/dL — AB
Non-HDL Cholesterol (Calc): 155 mg/dL (calc) — ABNORMAL HIGH (ref <130)
TRIGLYCERIDES: 77 mg/dL (ref <150)

## 2018-03-30 DIAGNOSIS — H11153 Pinguecula, bilateral: Secondary | ICD-10-CM | POA: Diagnosis not present

## 2018-03-30 DIAGNOSIS — H11823 Conjunctivochalasis, bilateral: Secondary | ICD-10-CM | POA: Diagnosis not present

## 2018-03-30 DIAGNOSIS — H40053 Ocular hypertension, bilateral: Secondary | ICD-10-CM | POA: Diagnosis not present

## 2018-04-15 IMAGING — US US EXTREM UP *R* LTD
1 series · 11 of 11 positions shown · non-contrast
Comparison: None

CLINICAL DATA: Mass along the right hand

EXAM:
ULTRASOUND RIGHT UPPER EXTREMITY LIMITED
TECHNIQUE: Ultrasound examination of the upper extremity soft tissues was
performed in the area of clinical concern.

[Series 1: us extrem up *right* ltd · 0.06mm/px · 11 of 11 slices shown]
[im 1/11]
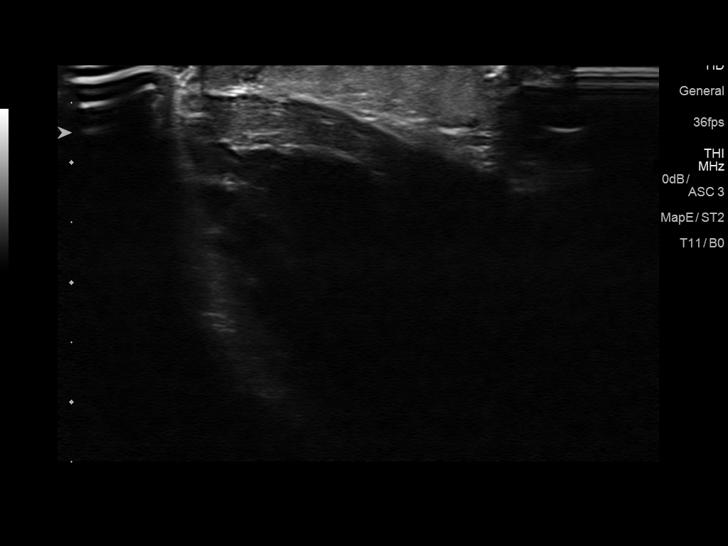
[im 2/11]
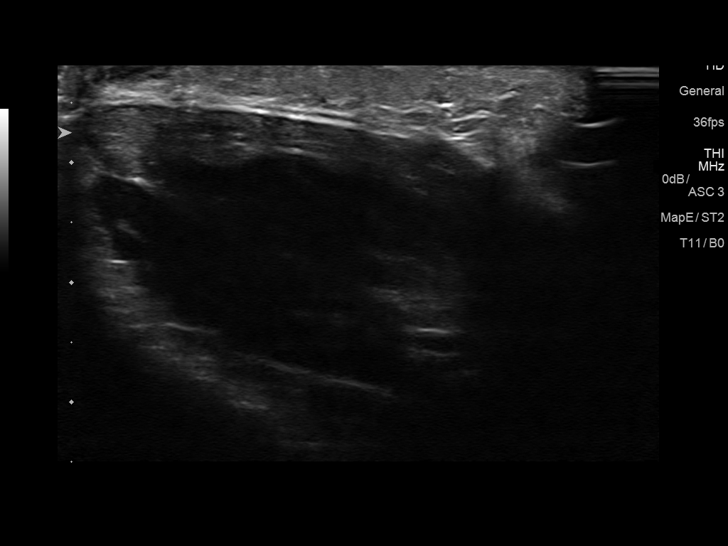
[im 3/11]
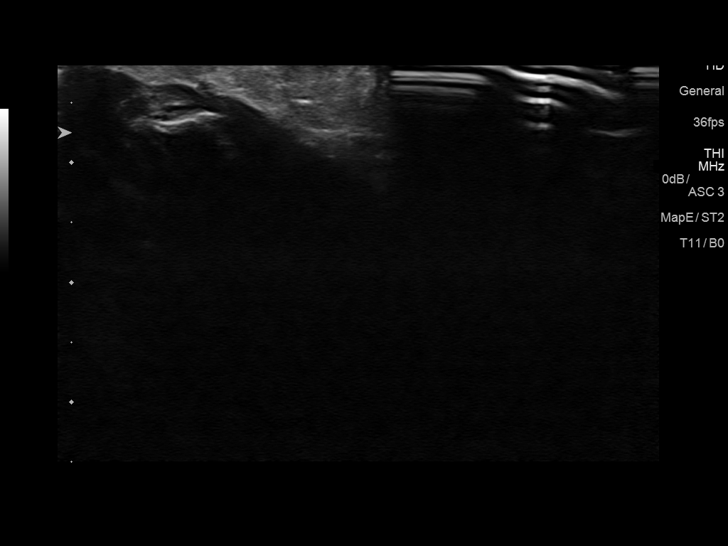
[im 4/11]
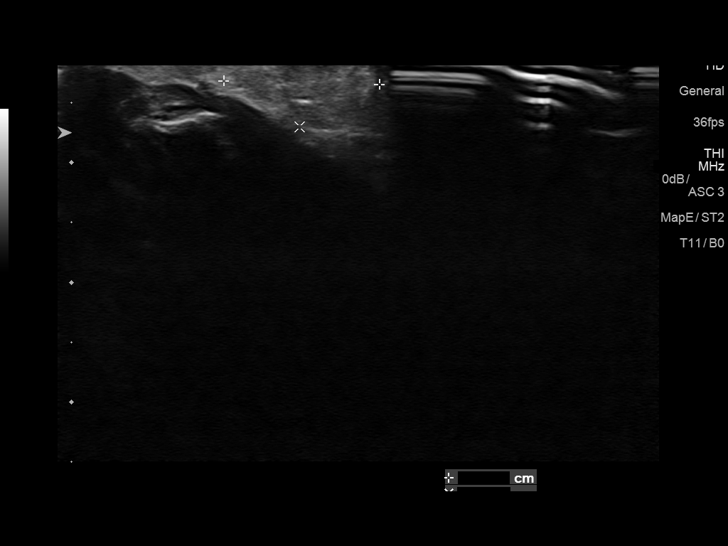
[im 5/11]
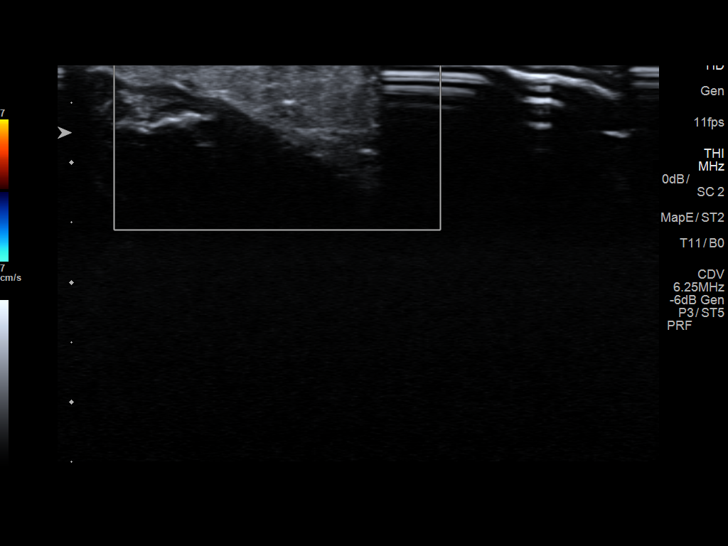
[im 6/11]
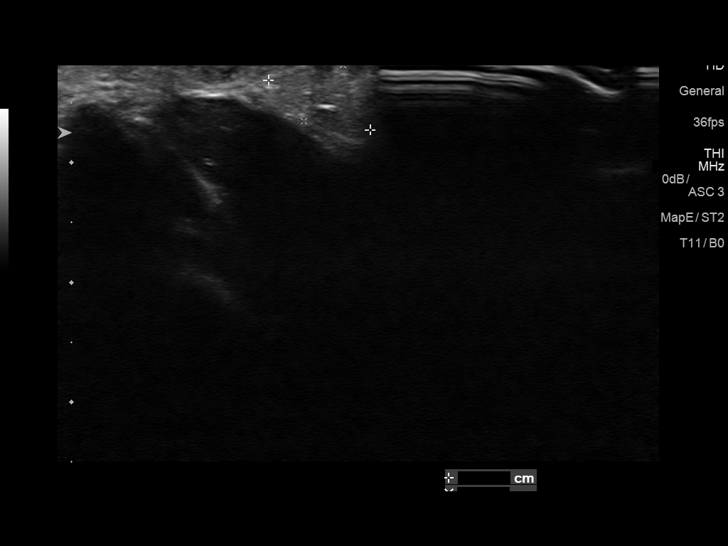
[im 7/11]
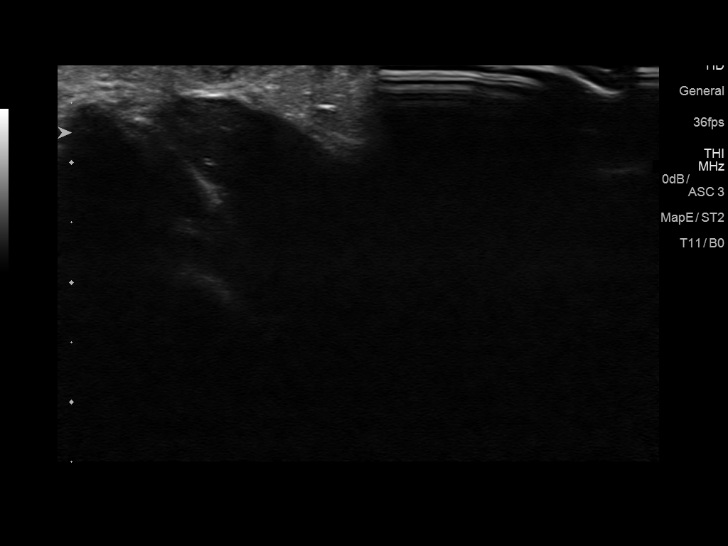
[im 8/11]
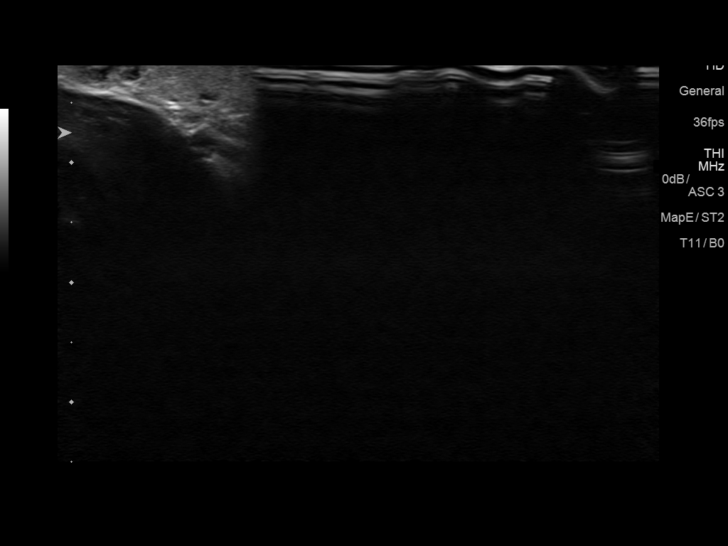
[im 9/11]
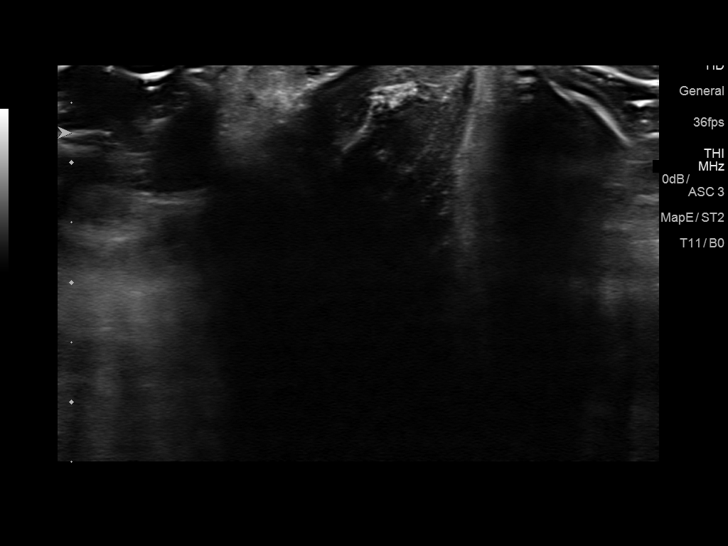
[im 10/11]
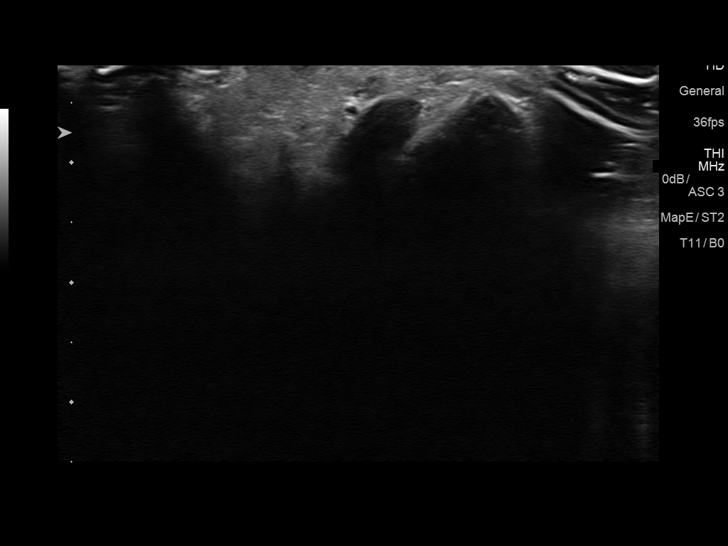
[im 11/11]
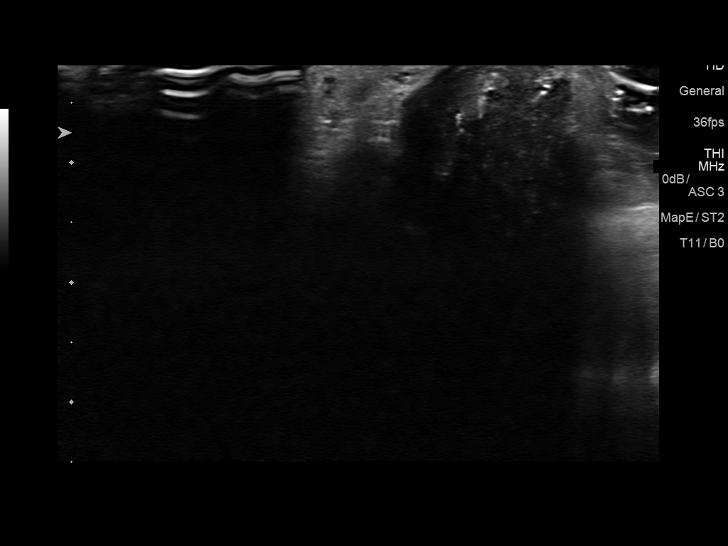

[11 of 11 positions shown; findings below may reference images not displayed]

FINDINGS: Indistinctly marginated 1.3 by 0.6 by 1.0 cm solid lesion
corresponding to the palpable abnormality along the base of the
thumb.
IMPRESSION: 1. Ultrasound suggests a small solid mass corresponding to the
palpable abnormality. MRI may be helpful in further workup if
clinically warranted.

## 2018-04-16 ENCOUNTER — Ambulatory Visit: Payer: Medicare HMO | Admitting: Certified Nurse Midwife

## 2018-07-07 IMAGING — US US AORTA SCREENING (MEDICARE)
1 series · 14 of 16 positions shown · non-contrast
Comparison: None.

CLINICAL DATA: Patient with family history of AAA.

EXAM:
US ABDOMINAL AORTA MEDICARE SCREENING
TECHNIQUE: Ultrasound examination of the abdominal aorta was performed as a
screening evaluation for abdominal aortic aneurysm.

[Series 1: us aorta screening (medicare) · 0.23mm/px · 14 of 16 slices shown]
[im 1/16]
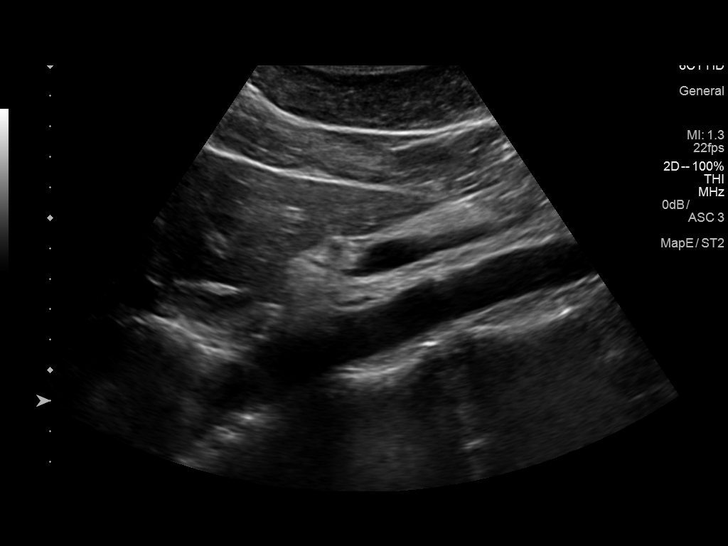
[im 2/16]
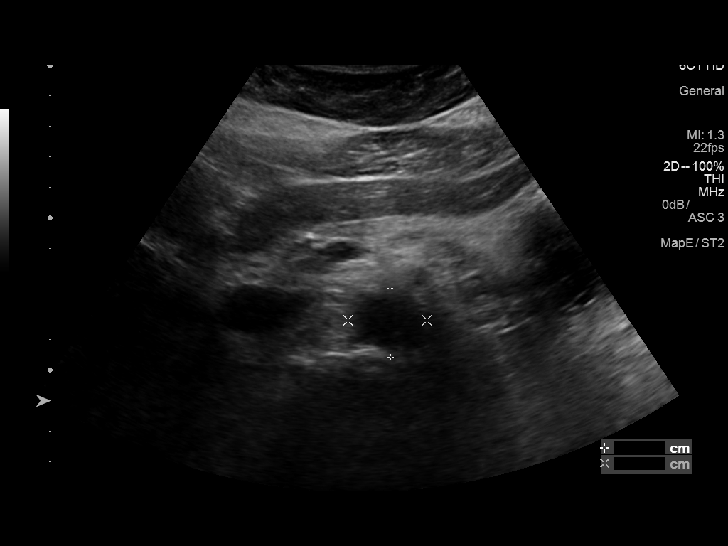
[im 3/16]
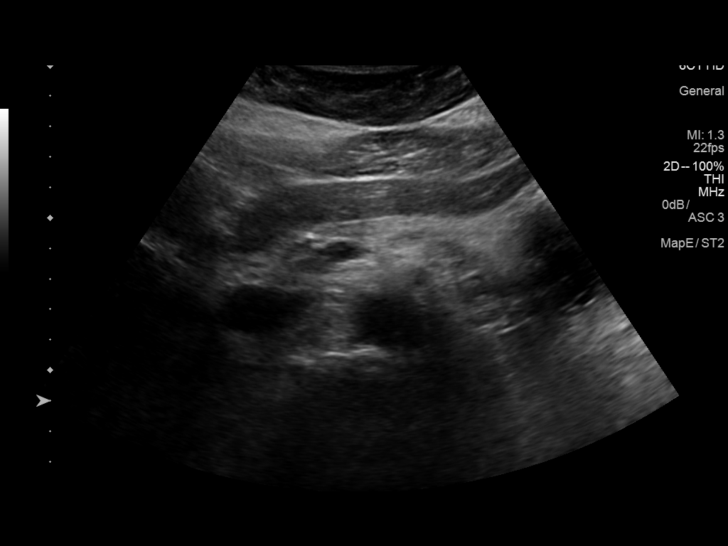
[im 5/16]
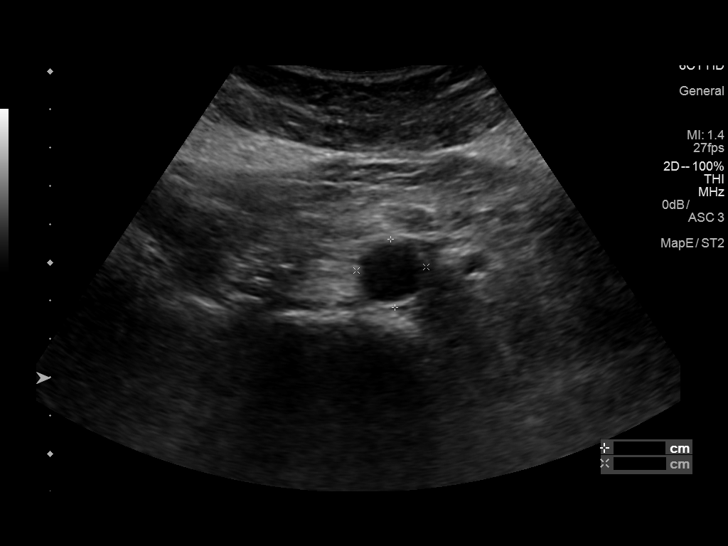
[im 6/16]
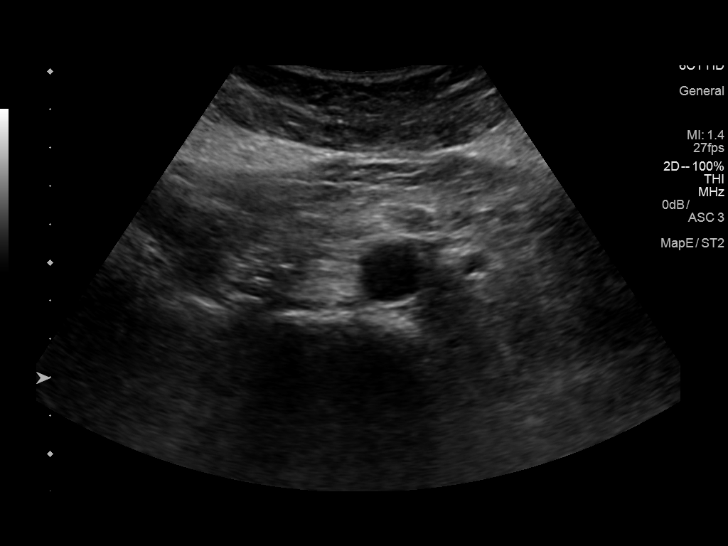
[im 7/16]
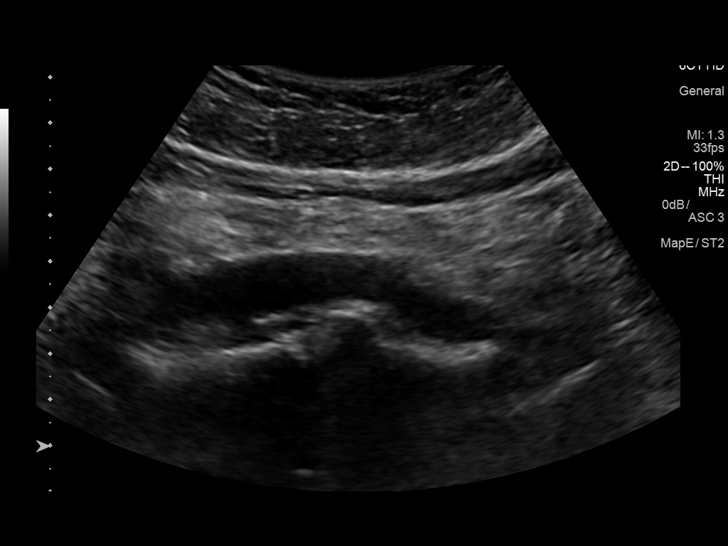
[im 8/16]
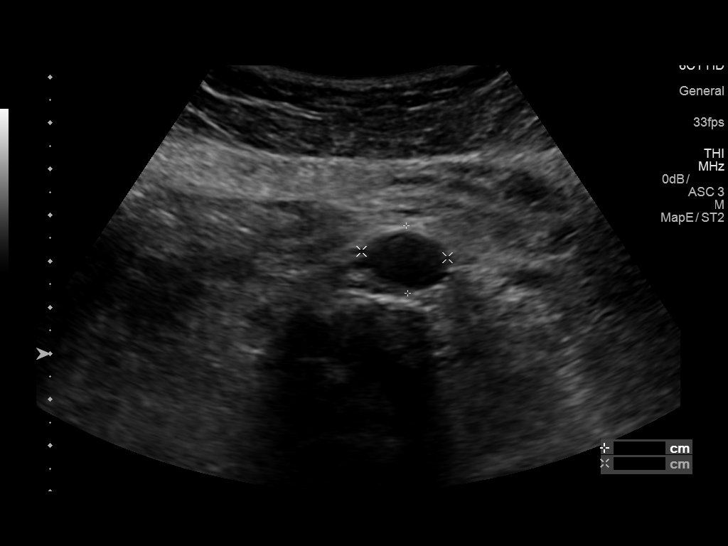
[im 9/16]
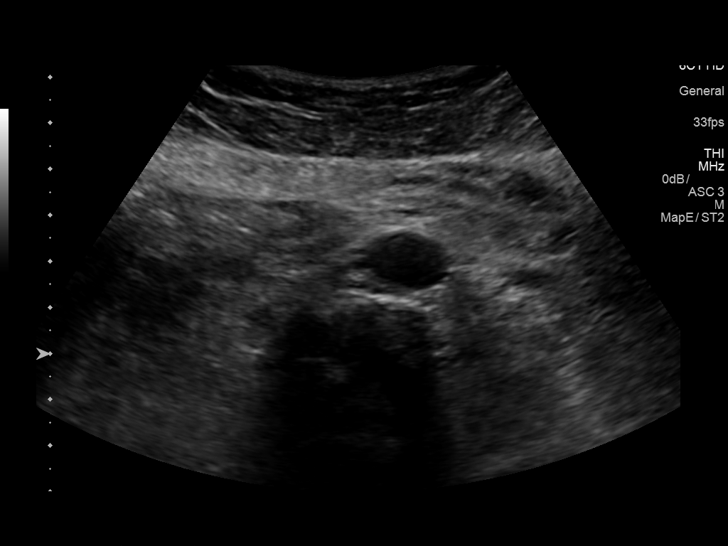
[im 10/16]
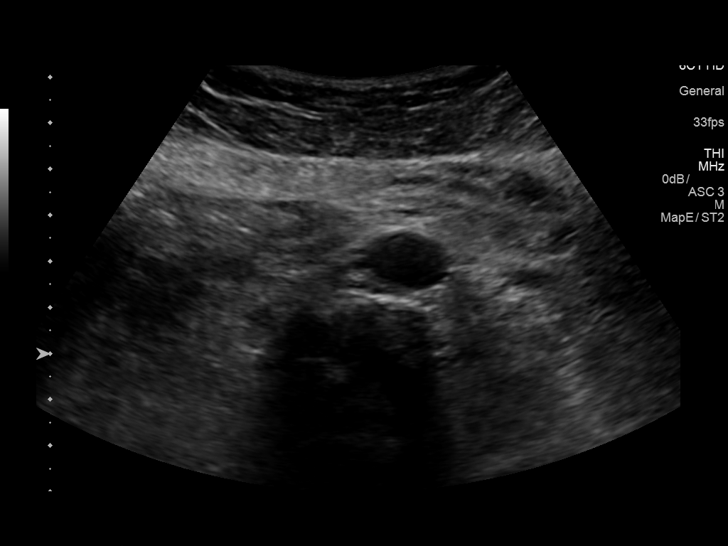
[im 11/16]
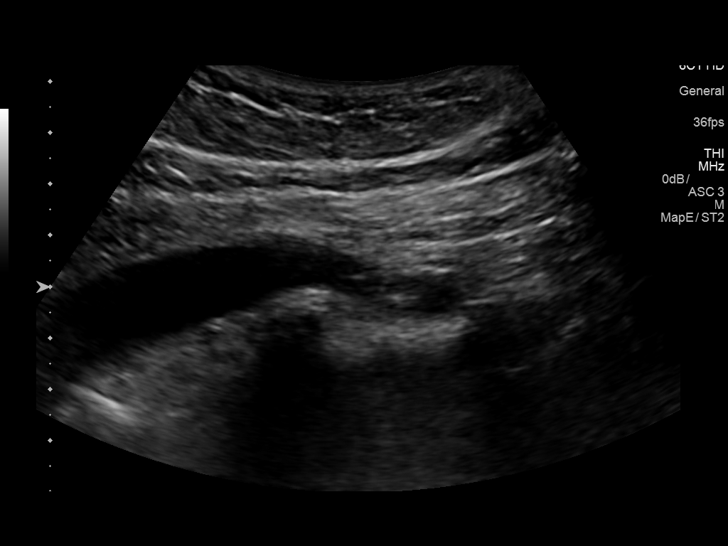
[im 13/16]
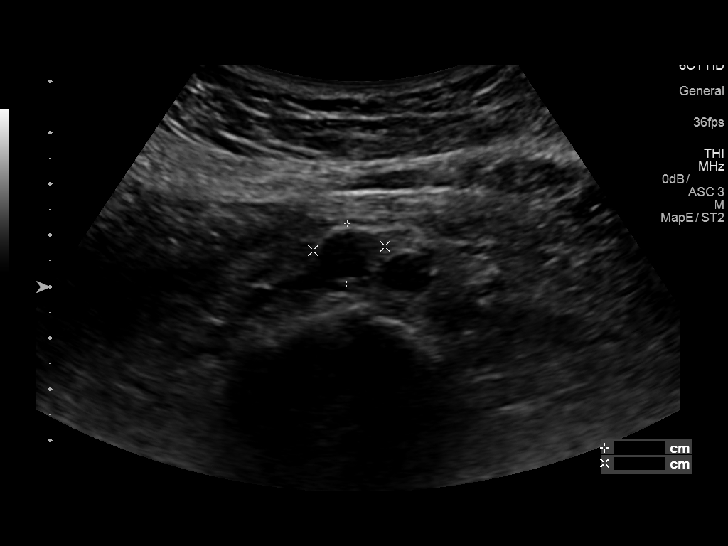
[im 14/16]
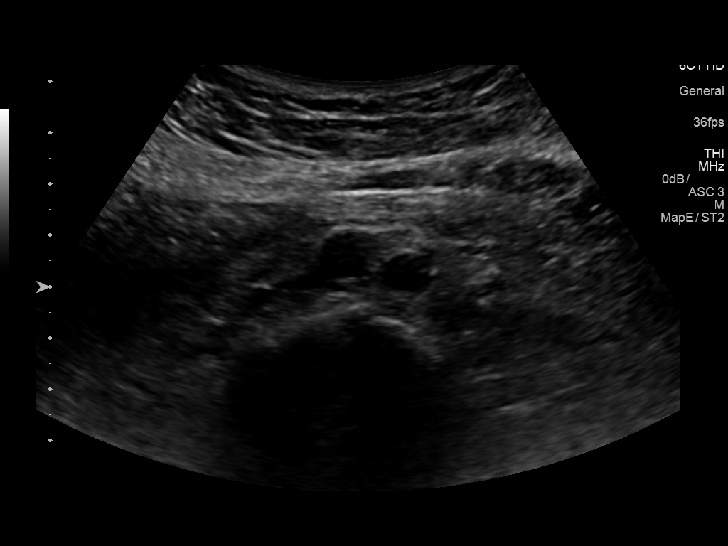
[im 15/16]
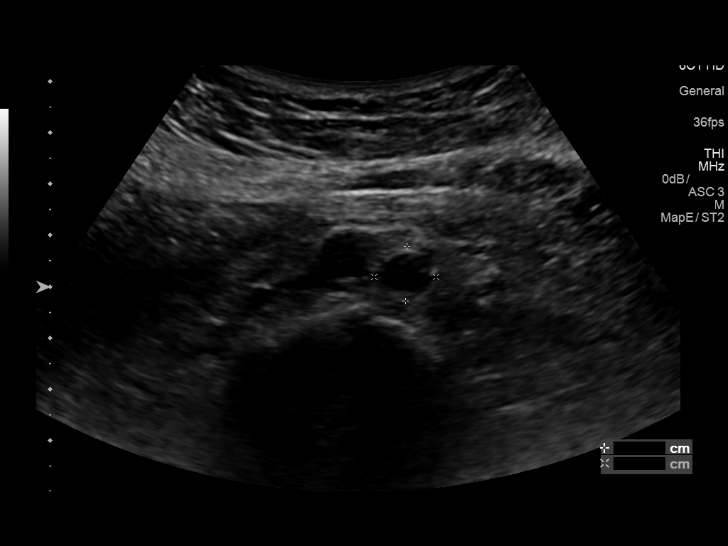
[im 16/16]
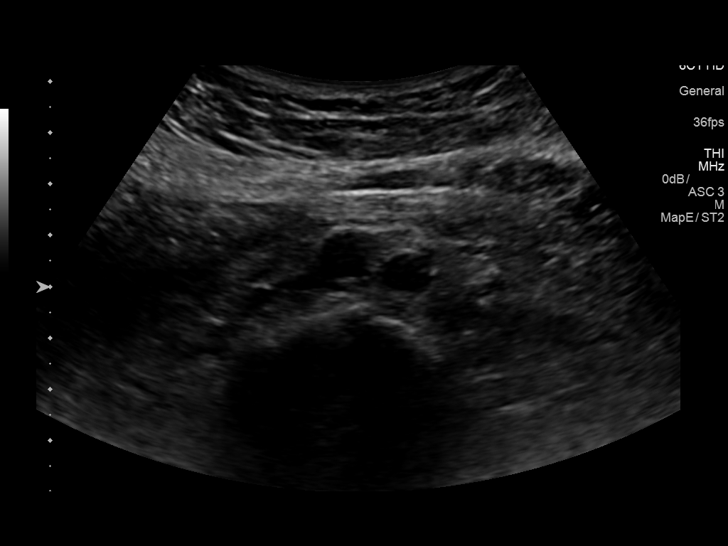

[14 of 16 positions shown; findings below may reference images not displayed]

FINDINGS: Abdominal aortic measurements as follows:

Proximal:  2.6 cm

Mid:  1.8 cm

Distal:  1.9 cm

Minor atherosclerotic change. Slight ectasia proximally. No
significant aneurysm.
IMPRESSION: Minor aortic atherosclerosis and proximal ectasia.

Maximal diameter 2.6 cm.

Ectatic abdominal aorta at risk for aneurysm development. Recommend
followup by ultrasound in 5 years. This recommendation follows ACR
consensus guidelines: White Paper of the ACR Incidental Findings
Committee II on Vascular Findings. [HOSPITAL] 0094;

## 2018-08-18 DIAGNOSIS — U071 COVID-19: Secondary | ICD-10-CM

## 2018-08-18 HISTORY — DX: COVID-19: U07.1

## 2018-09-16 ENCOUNTER — Encounter: Payer: Self-pay | Admitting: Certified Nurse Midwife

## 2018-09-16 DIAGNOSIS — Z1231 Encounter for screening mammogram for malignant neoplasm of breast: Secondary | ICD-10-CM | POA: Diagnosis not present

## 2018-10-08 DIAGNOSIS — L819 Disorder of pigmentation, unspecified: Secondary | ICD-10-CM | POA: Diagnosis not present

## 2018-10-08 DIAGNOSIS — Z85828 Personal history of other malignant neoplasm of skin: Secondary | ICD-10-CM | POA: Diagnosis not present

## 2018-10-08 DIAGNOSIS — L821 Other seborrheic keratosis: Secondary | ICD-10-CM | POA: Diagnosis not present

## 2018-10-08 DIAGNOSIS — D225 Melanocytic nevi of trunk: Secondary | ICD-10-CM | POA: Diagnosis not present

## 2018-10-08 DIAGNOSIS — D2372 Other benign neoplasm of skin of left lower limb, including hip: Secondary | ICD-10-CM | POA: Diagnosis not present

## 2018-10-08 DIAGNOSIS — D1801 Hemangioma of skin and subcutaneous tissue: Secondary | ICD-10-CM | POA: Diagnosis not present

## 2018-10-08 DIAGNOSIS — L814 Other melanin hyperpigmentation: Secondary | ICD-10-CM | POA: Diagnosis not present

## 2018-10-08 DIAGNOSIS — B078 Other viral warts: Secondary | ICD-10-CM | POA: Diagnosis not present

## 2018-10-15 DIAGNOSIS — H43813 Vitreous degeneration, bilateral: Secondary | ICD-10-CM | POA: Diagnosis not present

## 2018-10-15 DIAGNOSIS — H11153 Pinguecula, bilateral: Secondary | ICD-10-CM | POA: Diagnosis not present

## 2018-10-15 DIAGNOSIS — H5201 Hypermetropia, right eye: Secondary | ICD-10-CM | POA: Diagnosis not present

## 2018-10-15 DIAGNOSIS — H52221 Regular astigmatism, right eye: Secondary | ICD-10-CM | POA: Diagnosis not present

## 2018-10-15 DIAGNOSIS — H11823 Conjunctivochalasis, bilateral: Secondary | ICD-10-CM | POA: Diagnosis not present

## 2018-10-15 DIAGNOSIS — H40053 Ocular hypertension, bilateral: Secondary | ICD-10-CM | POA: Diagnosis not present

## 2018-10-15 DIAGNOSIS — H524 Presbyopia: Secondary | ICD-10-CM | POA: Diagnosis not present

## 2018-10-15 DIAGNOSIS — H5202 Hypermetropia, left eye: Secondary | ICD-10-CM | POA: Diagnosis not present

## 2018-12-06 ENCOUNTER — Other Ambulatory Visit: Payer: Self-pay

## 2018-12-06 ENCOUNTER — Ambulatory Visit (INDEPENDENT_AMBULATORY_CARE_PROVIDER_SITE_OTHER): Payer: Medicare HMO

## 2018-12-06 DIAGNOSIS — Z23 Encounter for immunization: Secondary | ICD-10-CM

## 2019-02-22 DIAGNOSIS — H04123 Dry eye syndrome of bilateral lacrimal glands: Secondary | ICD-10-CM | POA: Diagnosis not present

## 2019-02-22 DIAGNOSIS — H11823 Conjunctivochalasis, bilateral: Secondary | ICD-10-CM | POA: Diagnosis not present

## 2019-03-28 ENCOUNTER — Other Ambulatory Visit: Payer: Self-pay

## 2019-03-28 ENCOUNTER — Encounter: Payer: Self-pay | Admitting: Family Medicine

## 2019-03-28 ENCOUNTER — Ambulatory Visit (INDEPENDENT_AMBULATORY_CARE_PROVIDER_SITE_OTHER): Payer: Medicare HMO | Admitting: Family Medicine

## 2019-03-28 VITALS — BP 122/64 | HR 74 | Temp 97.9°F | Resp 16 | Ht 68.0 in | Wt 169.0 lb

## 2019-03-28 DIAGNOSIS — I700 Atherosclerosis of aorta: Secondary | ICD-10-CM | POA: Diagnosis not present

## 2019-03-28 DIAGNOSIS — Z0000 Encounter for general adult medical examination without abnormal findings: Secondary | ICD-10-CM

## 2019-03-28 DIAGNOSIS — E7800 Pure hypercholesterolemia, unspecified: Secondary | ICD-10-CM | POA: Diagnosis not present

## 2019-03-28 DIAGNOSIS — M8589 Other specified disorders of bone density and structure, multiple sites: Secondary | ICD-10-CM | POA: Diagnosis not present

## 2019-03-28 DIAGNOSIS — E559 Vitamin D deficiency, unspecified: Secondary | ICD-10-CM

## 2019-03-28 DIAGNOSIS — H9193 Unspecified hearing loss, bilateral: Secondary | ICD-10-CM | POA: Diagnosis not present

## 2019-03-28 MED ORDER — VITAMIN D (CHOLECALCIFEROL) 25 MCG (1000 UT) PO CAPS: 1.0000 | ORAL_CAPSULE | Freq: Every day | ORAL | Status: AC

## 2019-03-28 NOTE — Progress Notes (Signed)
Subjective:   Patient presents for Medicare Annual/Subsequent preventive examination.  Patient presents for Medicare Annual/Subsequent preventive examination.     No specific concerns  Still follows with dermatology secondary history of skin cancer.  Also follows with ophthalmology secondary to family history of degenerative eye disease  Has history of hyperlipidemia had some mild atherosclerosis on her ultrasound of the aorta 3 years ago.  She declined statin drug therapy at that time.  She has been trying to monitor her cholesterol with dietary changes and exercise.  Her weight is down 7 pounds intentionally from last year.   Review Past Medical/Family/Social: Per EMR    Risk Factors  Current exercise habits: Walks almost daily Dietary issues discussed: No specific concerns  Cardiac risk factors: Hyperlipidemia   Depression Screen  (Note: if answer to either of the following is "Yes", a more complete depression screening is indicated)  Over the past two weeks, have you felt down, depressed or hopeless? No Over the past two weeks, have you felt little interest or pleasure in doing things? No Have you lost interest or pleasure in daily life? No Do you often feel hopeless? No Do you cry easily over simple problems? No   Activities of Daily Living  In your present state of health, do you have any difficulty performing the following activities?:  Driving? No  Managing money? No  Feeding yourself? No  Getting from bed to chair? No  Climbing a flight of stairs? No  Preparing food and eating?: No  Bathing or showering? No  Getting dressed: No  Getting to the toilet? No  Using the toilet:No  Moving around from place to place: No  In the past year have you fallen or had a near fall?:No  Are you sexually active? No  Do you have more than one partner? No   Hearing Difficulties:  Yes wears hearing aids Do you often ask people to speak up or repeat themselves? yes  Do you  experience ringing or noises in your ears? No Do you have difficulty understanding soft or whispered voices? yes Do you feel that you have a problem with memory? No Do you often misplace items? No  Do you feel safe at home? Yes  Cognitive Testing  Alert? Yes Normal Appearance?Yes  Oriented to person? Yes Place? Yes  Time? Yes  Recall of three objects? Yes  Can perform simple calculations? Yes  Displays appropriate judgment?Yes  Can read the correct time from a watch face?Yes   LList the Names of Other Physician/Practitioners you currently use: GYN- [facility]- Charlane Ferretti  Audiology   Eye Doctor- Dr. Raynelle Bring Dermatology- Dr. Ubaldo Glassing  Screening Tests / DateUTD Colonoscopy UTD Bone density up-to-date-osteopenia - Due July  2021 Shingrix-up-to-date Mammogram  UTD Influenza Vaccine  UTD Tetanus/tdap UTD Pneumonia- UTD  ROS: GEN- denies fatigue, fever, weight loss,weakness, recent illness HEENT- denies eye drainage, change in vision, nasal discharge, CVS- denies chest pain, palpitations RESP- denies SOB, cough, wheeze ABD- denies N/V, change in stools, abd pain GU- denies dysuria, hematuria, dribbling, incontinence MSK-denies joint pain, muscle aches, injury Neuro- denies headache, dizziness, syncope, seizure activity  PHYSICAL: Vitals reviewed GEN- NAD, alert and oriented x3 HEENT- PERRL, EOMI, non injected sclera, pink conjunctiva, TM clear bilaterally canals clear hearing aids Neck- Supple, no thryomegaly, no bruit  CVS- RRR, no murmur RESP-CTAB ABD-NABS,soft,NT,ND EXT- No edema Pulses- Radial, DP- 2+   Assessment:    Annual wellness medicare exam   Plan:  During the course of the visit the patient was educated and counseled about appropriate screening and preventive services including:  FALL/DEPRESSION/AUDIT C Screening- Negative  Aortic atherosclerosis/Hyperlipidemia- recheck lipids, monitoring  diet immunizations UTD  Osteopenia weightbearing exercise continue calcium and vitamin D.  She has history of vitamin D deficiency she is taking about 2000 international units a day based on her multivitamin and her vitamin D alone.  We will recheck her levels today.  She will schedule her bone density for this July when her mammogram is due  Prevention up-to-date  Hearing impaired followed by audiology    Full code  Diet review for nutrition referral? Yes ____ Not Indicated __x__  Patient Instructions (the written plan) was given to the patient.  Medicare Attestation  I have personally reviewed:  The patient's medical and social history  Their use of alcohol, tobacco or illicit drugs  Their current medications and supplements  The patient's functional ability including ADLs,fall risks, home safety risks, cognitive, and hearing and visual impairment  Diet and physical activities  Evidence for depression or mood disorders  The patient's weight, height, BMI, and visual acuity have been recorded in the chart. I have made referrals, counseling, and provided education to the patient based on review of the above and I have provided the patient with a written personalized care plan for preventive services.

## 2019-03-28 NOTE — Patient Instructions (Signed)
F/U 1 year for Physical We will call with lab results  

## 2019-03-29 LAB — COMPREHENSIVE METABOLIC PANEL
AG Ratio: 1.7 (calc) (ref 1.0–2.5)
ALT: 14 U/L (ref 6–29)
AST: 23 U/L (ref 10–35)
Albumin: 4.4 g/dL (ref 3.6–5.1)
Alkaline phosphatase (APISO): 85 U/L (ref 37–153)
BUN: 15 mg/dL (ref 7–25)
CO2: 31 mmol/L (ref 20–32)
Calcium: 9.9 mg/dL (ref 8.6–10.4)
Chloride: 103 mmol/L (ref 98–110)
Creat: 0.81 mg/dL (ref 0.50–0.99)
Globulin: 2.6 g/dL (calc) (ref 1.9–3.7)
Glucose, Bld: 89 mg/dL (ref 65–99)
Potassium: 5 mmol/L (ref 3.5–5.3)
Sodium: 140 mmol/L (ref 135–146)
Total Bilirubin: 0.4 mg/dL (ref 0.2–1.2)
Total Protein: 7 g/dL (ref 6.1–8.1)

## 2019-03-29 LAB — LIPID PANEL
Cholesterol: 231 mg/dL — ABNORMAL HIGH (ref <200)
HDL: 58 mg/dL (ref >=50)
LDL Cholesterol (Calc): 153 mg/dL (calc) — ABNORMAL HIGH
Non-HDL Cholesterol (Calc): 173 mg/dL (calc) — ABNORMAL HIGH (ref <130)
Total CHOL/HDL Ratio: 4 (calc) (ref <5.0)
Triglycerides: 91 mg/dL (ref <150)

## 2019-03-29 LAB — VITAMIN D 25 HYDROXY (VIT D DEFICIENCY, FRACTURES): Vit D, 25-Hydroxy: 31 ng/mL (ref 30–100)

## 2019-03-29 LAB — CBC WITH DIFFERENTIAL/PLATELET
Absolute Monocytes: 340 cells/uL (ref 200–950)
Basophils Absolute: 40 cells/uL (ref 0–200)
Basophils Relative: 0.8 %
Eosinophils Absolute: 70 cells/uL (ref 15–500)
Eosinophils Relative: 1.4 %
HCT: 40 % (ref 35.0–45.0)
Hemoglobin: 13.2 g/dL (ref 11.7–15.5)
Lymphs Abs: 1505 cells/uL (ref 850–3900)
MCH: 29.3 pg (ref 27.0–33.0)
MCHC: 33 g/dL (ref 32.0–36.0)
MCV: 88.9 fL (ref 80.0–100.0)
MPV: 10.9 fL (ref 7.5–12.5)
Monocytes Relative: 6.8 %
Neutro Abs: 3045 cells/uL (ref 1500–7800)
Neutrophils Relative %: 60.9 %
Platelets: 234 10*3/uL (ref 140–400)
RBC: 4.5 10*6/uL (ref 3.80–5.10)
RDW: 12.5 % (ref 11.0–15.0)
Total Lymphocyte: 30.1 %
WBC: 5 10*3/uL (ref 3.8–10.8)

## 2019-05-09 NOTE — Progress Notes (Deleted)
69 y.o. G2P2002 Married  Caucasian Fe here for annual exam.    Patient's last menstrual period was 11/17/2001 (approximate).          Sexually active: {yes no:314532}  The current method of family planning is tubal ligation.    Exercising: {yes no:314532}  {types:19826} Smoker:  {YES NO:22349}  ROS  Health Maintenance: Pap:  12-20-14 neg HPV HR neg, 04-01-17 neg HPV HR neg History of Abnormal Pap: yes MMG:  09-16-2018 category b density birads 1:neg Self Breast exams: {YES NO:22349} Colonoscopy:  2017 f/u 24yr BMD:   2019 TDaP:  2012 Shingles: 2019 Pneumonia: 2018 Hep C and HIV: hep c neg 2016 Labs: ***   reports that she has never smoked. She has never used smokeless tobacco. She reports that she does not drink alcohol or use drugs.  Past Medical History:  Diagnosis Date  . Abnormal Pap smear of cervix   . Hearing loss since childhood   bilateral hearing loss  . Irregular menses   . PONV (postoperative nausea and vomiting)   . SCC (squamous cell carcinoma), arm, right   . Skin lesion of left leg 12/08   atypical    Past Surgical History:  Procedure Laterality Date  . BREAST LUMPECTOMY Left 1995   benign  . COLONOSCOPY  07/04/2005   recheck in 10 years - Dr MCollene Mares . COLPOSCOPY  02/1996   benign  . COLPOSCOPY  12/1998   benign  . MASS EXCISION Right 11/18/2016   Procedure: EXCISIONAL BIOPSY MASS RIGHT LST WEB;  Surgeon: KDaryll Brod MD;  [facility]  Service: Orthopedics;  Laterality: Right;  REG/FAB  . TUBAL LIGATION Bilateral 1991    Current Outpatient Medications  Medication Sig Dispense Refill  . acetaminophen (TYLENOL) 500 MG tablet Take 500 mg by mouth every 6 (six) hours as needed.    . diazepam (VALIUM) 5 MG tablet Take 1 tablet 1 hour before dental procedure 3 tablet 0  . ibuprofen (ADVIL,MOTRIN) 200 MG tablet Take 200 mg by mouth every 6 (six) hours as needed.    . Lactobacillus (PROBIOTIC ACIDOPHILUS PO) Take by mouth.    .  Multiple Minerals-Vitamins (CALCIUM CITRATE PLUS PO) Take by mouth.    . Multiple Vitamin (MULTIVITAMIN) tablet Take 1 tablet by mouth daily.    . TURMERIC PO Take 1,500 mg by mouth.    . Vitamin D, Cholecalciferol, 25 MCG (1000 UT) CAPS Take 1 capsule by mouth daily. 60 capsule    No current facility-administered medications for this visit.    Family History  Problem Relation Age of Onset  . Hypertension Father   . Diabetes Paternal Grandmother   . Hypertension Mother   . Thyroid disease Mother   . Hypertension Brother   . Cancer Paternal Aunt [phone]      colon cancer  . Breast cancer Paternal Aunt     ROS:  Pertinent items are noted in HPI.  Otherwise, a comprehensive ROS was negative.  Exam:   LMP 11/17/2001 (Approximate)    Ht Readings from Last 3 Encounters:  03/28/19 '5\' 8"'$  (1.727 m)  03/23/18 5' 7.5" (1.715 m)  11/11/17 5' 7.5" (1.715 m)    General appearance: alert, cooperative and appears stated age Head: Normocephalic, without obvious abnormality, atraumatic Neck: no adenopathy, supple, symmetrical, trachea midline and thyroid {EXAM; THYROID:18604} Lungs: clear to auscultation bilaterally Breasts: {Exam; breast:13139::"normal appearance, no masses or tenderness"} Heart: regular rate and rhythm Abdomen: soft, non-tender; no masses,  no organomegaly Extremities: extremities normal, atraumatic, no cyanosis or edema Skin: Skin color, texture, turgor normal. No rashes or lesions Lymph nodes: Cervical, supraclavicular, and axillary nodes normal. No abnormal inguinal nodes palpated Neurologic: Grossly normal   Pelvic: External genitalia:  no lesions              Urethra:  normal appearing urethra with no masses, tenderness or lesions              Bartholin's and Skene's: normal                 Vagina: normal appearing vagina with normal color and discharge, no lesions              Cervix: {exam; cervix:14595}              Pap taken: {yes no:314532} Bimanual Exam:   Uterus:  {exam; uterus:12215}              Adnexa: {exam; adnexa:12223}               Rectovaginal: Confirms               Anus:  normal sphincter tone, no lesions  Chaperone present: ***  A:  Well Woman with normal exam  P:   Reviewed health and wellness pertinent to exam  Pap smear: {YES NO:22349}  {plan; gyn:5269::"mammogram","pap smear","return annually or prn"}  An After Visit Summary was printed and given to the patient.

## 2019-05-10 ENCOUNTER — Encounter: Payer: Self-pay | Admitting: Certified Nurse Midwife

## 2019-05-10 ENCOUNTER — Other Ambulatory Visit: Payer: Self-pay

## 2019-05-10 ENCOUNTER — Ambulatory Visit: Payer: Medicare HMO | Admitting: Certified Nurse Midwife

## 2019-06-27 ENCOUNTER — Other Ambulatory Visit: Payer: Self-pay

## 2019-07-25 ENCOUNTER — Other Ambulatory Visit: Payer: Self-pay

## 2019-07-25 ENCOUNTER — Other Ambulatory Visit: Payer: Medicare HMO

## 2019-07-25 DIAGNOSIS — E7800 Pure hypercholesterolemia, unspecified: Secondary | ICD-10-CM

## 2019-07-25 DIAGNOSIS — R748 Abnormal levels of other serum enzymes: Secondary | ICD-10-CM | POA: Diagnosis not present

## 2019-07-25 LAB — HEPATIC FUNCTION PANEL
AG Ratio: 1.8 (calc) (ref 1.0–2.5)
ALT: 16 U/L (ref 6–29)
AST: 24 U/L (ref 10–35)
Albumin: 4.2 g/dL (ref 3.6–5.1)
Alkaline phosphatase (APISO): 81 U/L (ref 37–153)
Bilirubin, Direct: 0.1 mg/dL (ref 0.0–0.2)
Globulin: 2.3 g/dL (calc) (ref 1.9–3.7)
Indirect Bilirubin: 0.3 mg/dL (calc) (ref 0.2–1.2)
Total Bilirubin: 0.4 mg/dL (ref 0.2–1.2)
Total Protein: 6.5 g/dL (ref 6.1–8.1)

## 2019-07-25 LAB — LIPID PANEL
Cholesterol: 211 mg/dL — ABNORMAL HIGH (ref <200)
HDL: 49 mg/dL — ABNORMAL LOW (ref >=50)
LDL Cholesterol (Calc): 137 mg/dL (calc) — ABNORMAL HIGH
Non-HDL Cholesterol (Calc): 162 mg/dL (calc) — ABNORMAL HIGH (ref <130)
Total CHOL/HDL Ratio: 4.3 (calc) (ref <5.0)
Triglycerides: 123 mg/dL (ref <150)

## 2019-10-11 DIAGNOSIS — Z1231 Encounter for screening mammogram for malignant neoplasm of breast: Secondary | ICD-10-CM | POA: Diagnosis not present

## 2019-10-13 DIAGNOSIS — Z822 Family history of deafness and hearing loss: Secondary | ICD-10-CM | POA: Diagnosis not present

## 2019-10-13 DIAGNOSIS — H903 Sensorineural hearing loss, bilateral: Secondary | ICD-10-CM | POA: Diagnosis not present

## 2019-10-18 DIAGNOSIS — H524 Presbyopia: Secondary | ICD-10-CM | POA: Diagnosis not present

## 2019-10-18 DIAGNOSIS — H5203 Hypermetropia, bilateral: Secondary | ICD-10-CM | POA: Diagnosis not present

## 2019-11-09 ENCOUNTER — Other Ambulatory Visit: Payer: Self-pay | Admitting: *Deleted

## 2019-11-09 ENCOUNTER — Telehealth: Payer: Self-pay | Admitting: *Deleted

## 2019-11-09 DIAGNOSIS — Z20822 Contact with and (suspected) exposure to covid-19: Secondary | ICD-10-CM

## 2019-11-09 NOTE — Telephone Encounter (Signed)
Received call from patient.   Requested COVID antibody testing.   PCP agreeable.   Call placed to patient. [place].   Future orders placed.

## 2019-11-11 ENCOUNTER — Other Ambulatory Visit: Payer: Medicare HMO

## 2019-11-11 ENCOUNTER — Other Ambulatory Visit: Payer: Self-pay

## 2019-11-11 DIAGNOSIS — Z20822 Contact with and (suspected) exposure to covid-19: Secondary | ICD-10-CM | POA: Diagnosis not present

## 2019-11-11 NOTE — Telephone Encounter (Signed)
Call placed to patient and patient made aware.  

## 2019-11-14 LAB — SARS-COV-2 ANTIBODY(IGG)SPIKE,SEMI-QUANTITATIVE: SARS COV1 AB(IGG)SPIKE,SEMI QN: <1 index (ref <1.00)

## 2019-11-22 DIAGNOSIS — R69 Illness, unspecified: Secondary | ICD-10-CM | POA: Diagnosis not present

## 2019-12-22 DIAGNOSIS — D2372 Other benign neoplasm of skin of left lower limb, including hip: Secondary | ICD-10-CM | POA: Diagnosis not present

## 2019-12-22 DIAGNOSIS — L905 Scar conditions and fibrosis of skin: Secondary | ICD-10-CM | POA: Diagnosis not present

## 2019-12-22 DIAGNOSIS — L738 Other specified follicular disorders: Secondary | ICD-10-CM | POA: Diagnosis not present

## 2019-12-22 DIAGNOSIS — L819 Disorder of pigmentation, unspecified: Secondary | ICD-10-CM | POA: Diagnosis not present

## 2019-12-22 DIAGNOSIS — D225 Melanocytic nevi of trunk: Secondary | ICD-10-CM | POA: Diagnosis not present

## 2019-12-22 DIAGNOSIS — L821 Other seborrheic keratosis: Secondary | ICD-10-CM | POA: Diagnosis not present

## 2019-12-22 DIAGNOSIS — Z85828 Personal history of other malignant neoplasm of skin: Secondary | ICD-10-CM | POA: Diagnosis not present

## 2019-12-22 DIAGNOSIS — L814 Other melanin hyperpigmentation: Secondary | ICD-10-CM | POA: Diagnosis not present

## 2019-12-22 DIAGNOSIS — D1801 Hemangioma of skin and subcutaneous tissue: Secondary | ICD-10-CM | POA: Diagnosis not present

## 2020-01-24 DIAGNOSIS — Z0100 Encounter for examination of eyes and vision without abnormal findings: Secondary | ICD-10-CM | POA: Diagnosis not present

## 2020-02-07 DIAGNOSIS — R69 Illness, unspecified: Secondary | ICD-10-CM | POA: Diagnosis not present

## 2020-03-28 ENCOUNTER — Encounter: Payer: Self-pay | Admitting: Family Medicine

## 2020-03-28 ENCOUNTER — Ambulatory Visit (INDEPENDENT_AMBULATORY_CARE_PROVIDER_SITE_OTHER): Payer: Medicare HMO | Admitting: Family Medicine

## 2020-03-28 ENCOUNTER — Other Ambulatory Visit: Payer: Self-pay

## 2020-03-28 VITALS — BP 122/64 | HR 78 | Temp 98.2°F | Resp 14 | Ht 68.0 in | Wt 170.0 lb

## 2020-03-28 DIAGNOSIS — E7800 Pure hypercholesterolemia, unspecified: Secondary | ICD-10-CM

## 2020-03-28 DIAGNOSIS — M8589 Other specified disorders of bone density and structure, multiple sites: Secondary | ICD-10-CM | POA: Diagnosis not present

## 2020-03-28 DIAGNOSIS — H9193 Unspecified hearing loss, bilateral: Secondary | ICD-10-CM | POA: Diagnosis not present

## 2020-03-28 DIAGNOSIS — I77811 Abdominal aortic ectasia: Secondary | ICD-10-CM | POA: Diagnosis not present

## 2020-03-28 DIAGNOSIS — Z0000 Encounter for general adult medical examination without abnormal findings: Secondary | ICD-10-CM

## 2020-03-28 DIAGNOSIS — Z0001 Encounter for general adult medical examination with abnormal findings: Secondary | ICD-10-CM | POA: Diagnosis not present

## 2020-03-28 DIAGNOSIS — I700 Atherosclerosis of aorta: Secondary | ICD-10-CM

## 2020-03-28 DIAGNOSIS — E559 Vitamin D deficiency, unspecified: Secondary | ICD-10-CM

## 2020-03-28 NOTE — Patient Instructions (Addendum)
Call GYN for appointment  Bone Density at [place] at pharmacy for TDAP We will call with lab results Call GI for the colonoscopy this summer F/U 1 year for Physical - Rebecca Howe

## 2020-03-28 NOTE — Progress Notes (Signed)
Subjective:   Patient presents for Medicare Annual/Subsequent preventive examination.    No specific concerns  Still follows with dermatology secondary history of skin cancer.      follows with ophthalmology secondary to family history of degenerative eye disease  Has history of hyperlipidemia had some mild atherosclerosis on her ultrasound of the aorta 3 years ago. She declined statin drug therapy at that time.  She has been trying to monitor her cholesterol with dietary changes and exercise.    Review Past Medical/Family/Social:Per EMR   Risk Factors Current exercise habits:Walks almost daily Dietary issues discussed:No specific concerns  Cardiac risk factors:Hyperlipidemia  Depression Screen (Note: if answer to either of the following is "Yes", a more complete depression screening is indicated)  Over the past two weeks, have you felt down, depressed or hopeless? No Over the past two weeks, have you felt little interest or pleasure in doing things? No Have you lost interest or pleasure in daily life? No Do you often feel hopeless? No Do you cry easily over simple problems?No   Activities of Daily Living In your present state of health, do you have any difficulty performing the following activities?:  Driving? No  Managing money? No  Feeding yourself? No  Getting from bed to chair? No  Climbing a flight of stairs? No  Preparing food and eating?: No  Bathing or showering? No  Getting dressed: No  Getting to the toilet? No  Using the toilet:No  Moving around from place to place: No  In the past year have you fallen or had a near fall?:No    Hearing Difficulties: Yes wears hearing aids Do you often ask people to speak up or repeat themselves? yes  Do you experience ringing or noises in your ears? No Do you have difficulty understanding soft or whispered voices? yes Do you feel that you have a problem with memory? No Do you often misplace items?  No  Do you feel safe at home?Yes  Cognitive Testing Alert? Yes Normal Appearance?Yes  Oriented to person? Yes Place? Yes  Time? Yes  Recall of three objects? Yes  Can perform simple calculations? Yes  Displays appropriate judgment?Yes  Can read the correct time from a watch face?Yes   LList the Names of Other Physician/Practitioners you currently use: GYN- [facility]- Charlane Ferretti  Audiology  Eye Doctor- Dr. Raynelle Bring Dermatology- Dr. Ubaldo Glassing  Screening Tests / DateUTD ColonoscopyDue in June  Bone density up-to-date-osteopenia - Due July  2021 Shingrix-up-to-date MammogramUTD Influenza VaccineUTD Tetanus/tdapUTD Pneumonia- UTD  ROS: GEN- denies fatigue, fever, weight loss,weakness, recent illness HEENT- denies eye drainage, change in vision, nasal discharge, CVS- denies chest pain, palpitations RESP- denies SOB, cough, wheeze ABD- denies N/V, change in stools, abd pain GU- denies dysuria, hematuria, dribbling, incontinence MSK-deniesjoint pain, muscle aches, injury Neuro- denies headache, dizziness, syncope, seizure activity  PHYSICAL:Vitals reviewed GEN- NAD, alert and oriented x3 HEENT- PERRL, EOMI, non injected sclera, pink conjunctiva, TM clear bilaterally canals clearhearing aids Neck- Supple, no thryomegaly, no bruit CVS- RRR, no murmur RESP-CTAB ABD-NABS,soft,NT,ND EXT- No edema Pulses- Radial, DP- 2+   Assessment:   Annual wellness medicare exam   Plan:   During the course of the visit the patient was educated and counseled about appropriate screening and preventive services including: FALL/DEPRESSION/AUDIT C Screening- Negative  Aortic atherosclerosis/Hyperlipidemia- recheck lipids, monitoring diet  immunizations UTD - will plan to update TDAP at pharmacy   Osteopenia weightbearing exercise continue calcium and vitamin D.  She has  history of vitamin D deficiency she is  taking about 2000 international units a day based on her multivitamin and her vitamin D alone.  We will recheck her levels today.  She will schedule her bone density    Prevention up-to-date  Hearing impaired followed by audiology   Aortic ectasia due for repeat US of aorta in 2023   Full code  Diet review for nutrition referral? Yes ____ Not Indicated __x__  Patient Instructions (the written plan) was given to the patient.  Medicare Attestation  I have personally reviewed:  The patient's medical and social history  Their use of alcohol, tobacco or illicit drugs  Their current medications and supplements  The patient's functional ability including ADLs,fall risks, home safety risks, cognitive, and hearing and visual impairment  Diet and physical activities  Evidence for depression or mood disorders  The patient's weight, height, BMI, and visual acuity have been recorded in the chart. I have made referrals, counseling, and provided education to the patient based on review of the above and I have provided the patient with a written personalized care plan for preventive services.

## 2020-03-29 LAB — COMPREHENSIVE METABOLIC PANEL
AG Ratio: 1.7 (calc) (ref 1.0–2.5)
ALT: 16 U/L (ref 6–29)
AST: 26 U/L (ref 10–35)
Albumin: 4.4 g/dL (ref 3.6–5.1)
Alkaline phosphatase (APISO): 85 U/L (ref 37–153)
BUN: 13 mg/dL (ref 7–25)
CO2: 31 mmol/L (ref 20–32)
Calcium: 9.8 mg/dL (ref 8.6–10.4)
Chloride: 103 mmol/L (ref 98–110)
Creat: 0.88 mg/dL (ref 0.50–0.99)
Globulin: 2.6 g/dL (calc) (ref 1.9–3.7)
Glucose, Bld: 86 mg/dL (ref 65–99)
Potassium: 5.2 mmol/L (ref 3.5–5.3)
Sodium: 142 mmol/L (ref 135–146)
Total Bilirubin: 0.4 mg/dL (ref 0.2–1.2)
Total Protein: 7 g/dL (ref 6.1–8.1)

## 2020-03-29 LAB — CBC WITH DIFFERENTIAL/PLATELET
Absolute Monocytes: 340 cells/uL (ref 200–950)
Basophils Absolute: 30 cells/uL (ref 0–200)
Basophils Relative: 0.6 %
Eosinophils Absolute: 60 cells/uL (ref 15–500)
Eosinophils Relative: 1.2 %
HCT: 41.2 % (ref 35.0–45.0)
Hemoglobin: 13.5 g/dL (ref 11.7–15.5)
Lymphs Abs: 1460 cells/uL (ref 850–3900)
MCH: 29.3 pg (ref 27.0–33.0)
MCHC: 32.8 g/dL (ref 32.0–36.0)
MCV: 89.4 fL (ref 80.0–100.0)
MPV: 10.8 fL (ref 7.5–12.5)
Monocytes Relative: 6.8 %
Neutro Abs: 3110 cells/uL (ref 1500–7800)
Neutrophils Relative %: 62.2 %
Platelets: 236 10*3/uL (ref 140–400)
RBC: 4.61 10*6/uL (ref 3.80–5.10)
RDW: 12.5 % (ref 11.0–15.0)
Total Lymphocyte: 29.2 %
WBC: 5 10*3/uL (ref 3.8–10.8)

## 2020-03-29 LAB — VITAMIN D 25 HYDROXY (VIT D DEFICIENCY, FRACTURES): Vit D, 25-Hydroxy: 38 ng/mL (ref 30–100)

## 2020-03-29 LAB — LIPID PANEL
Cholesterol: 229 mg/dL — ABNORMAL HIGH (ref <200)
HDL: 55 mg/dL (ref >=50)
LDL Cholesterol (Calc): 152 mg/dL (calc) — ABNORMAL HIGH
Non-HDL Cholesterol (Calc): 174 mg/dL (calc) — ABNORMAL HIGH (ref <130)
Total CHOL/HDL Ratio: 4.2 (calc) (ref <5.0)
Triglycerides: 102 mg/dL (ref <150)

## 2020-04-03 ENCOUNTER — Other Ambulatory Visit: Payer: Self-pay

## 2020-04-03 DIAGNOSIS — E7800 Pure hypercholesterolemia, unspecified: Secondary | ICD-10-CM

## 2020-05-06 IMAGING — CR DG CHEST 2V
2 series · 2 of 2 positions shown · non-contrast
Comparison: None.

CLINICAL DATA: Productive cough, congestion and chest tightness for
the past 2 weeks.

EXAM:
CHEST - 2 VIEW

[w chest pa]
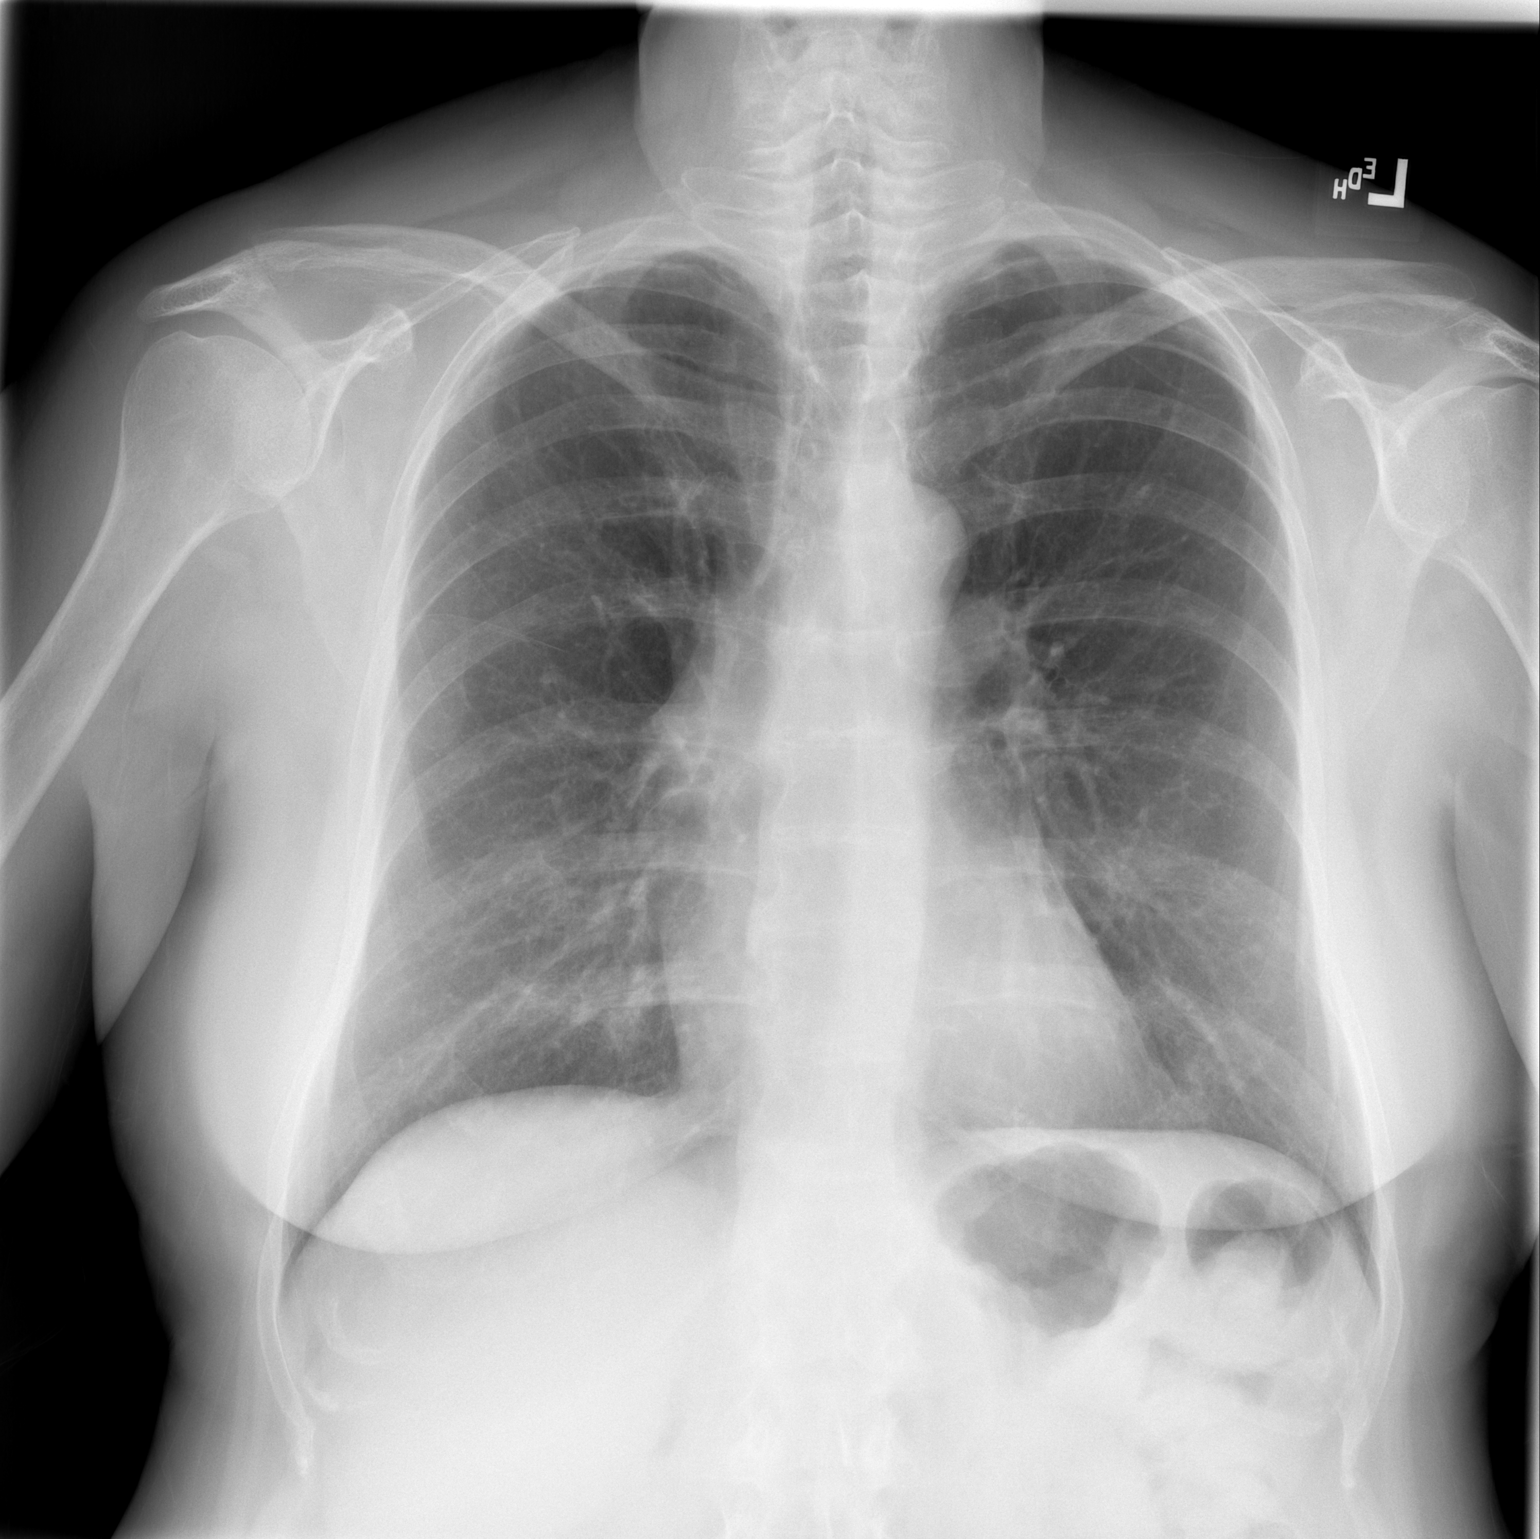

[w chest lat]
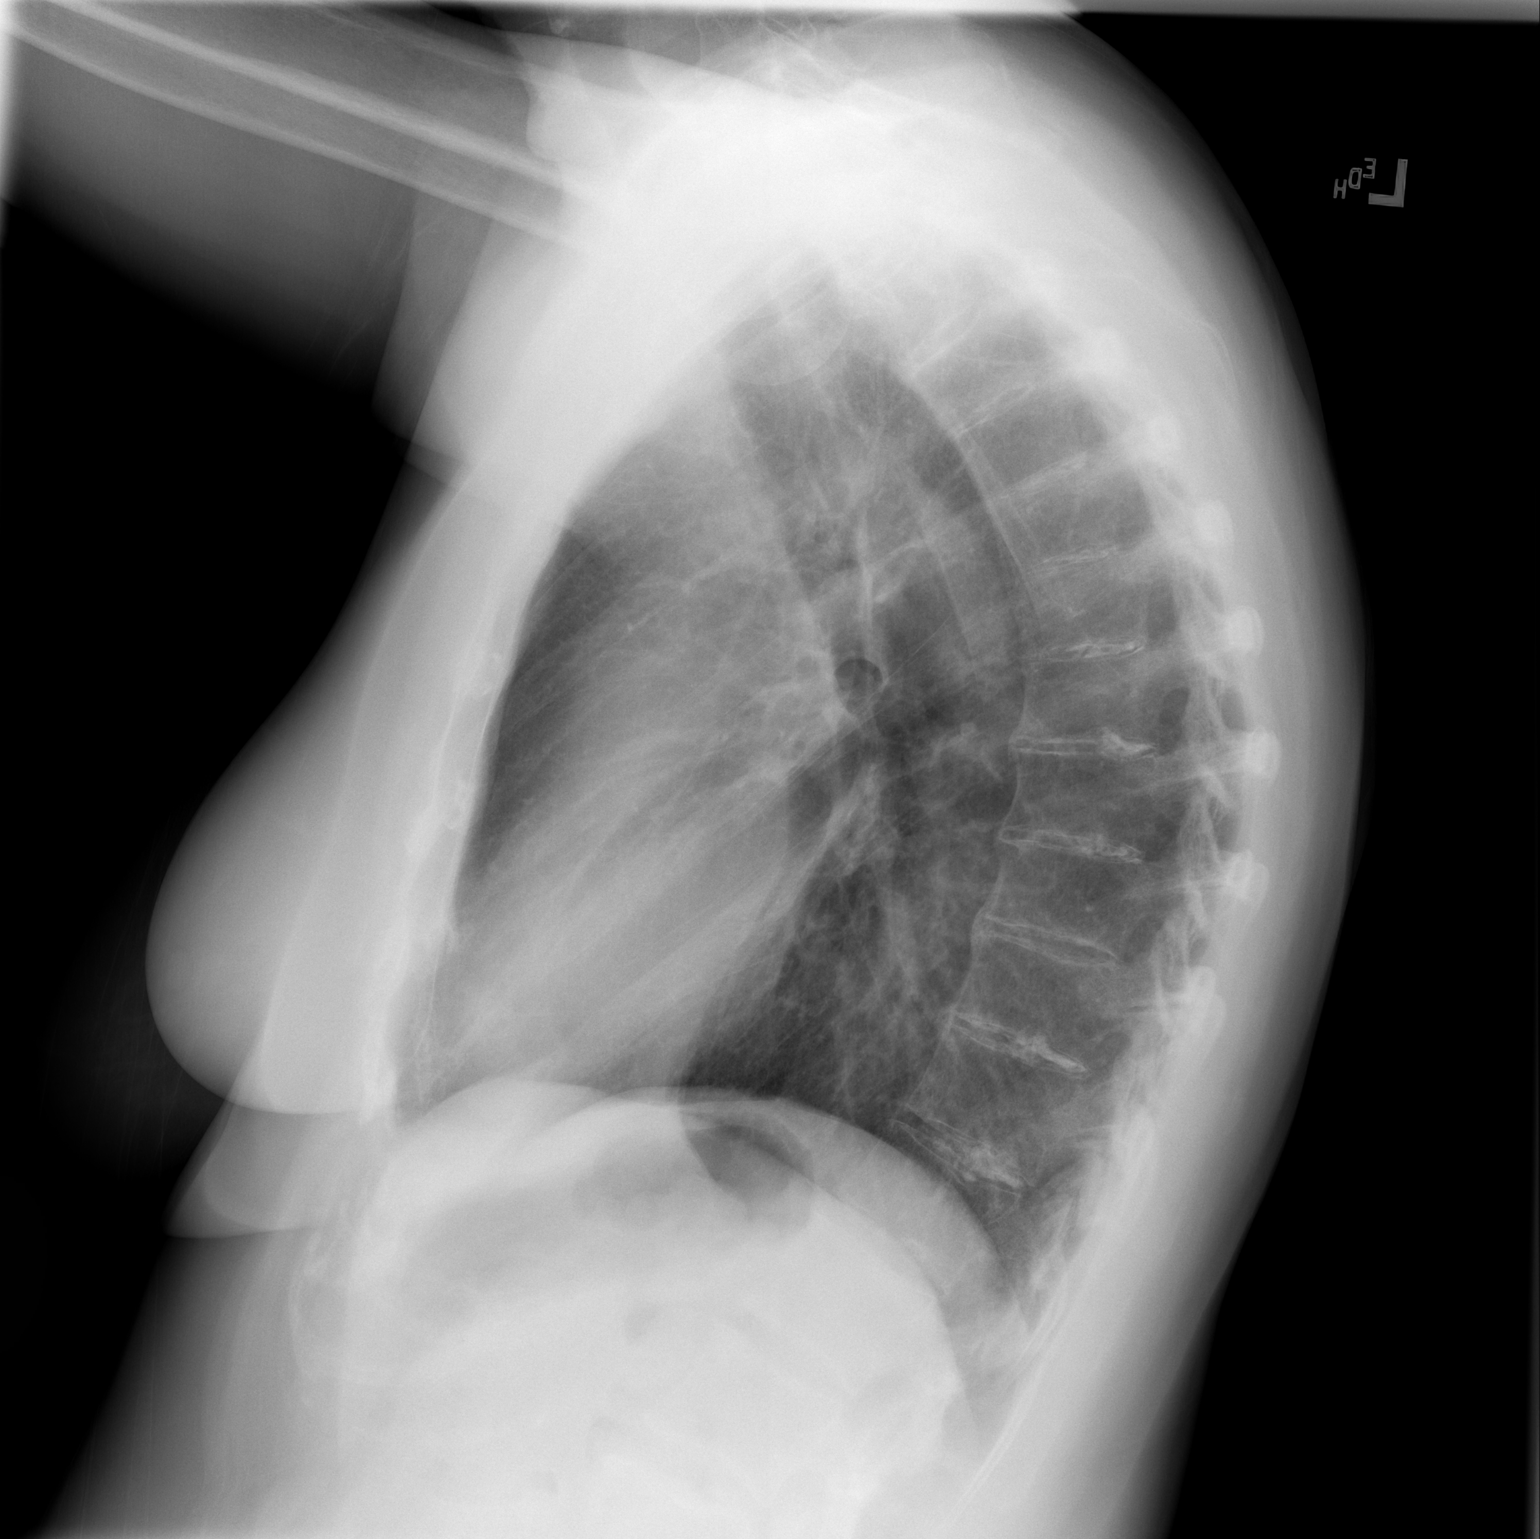

[2 of 2 positions shown; findings below may reference images not displayed]

FINDINGS: Normal cardiac silhouette and mediastinal contours. No focal
parenchymal opacities. No pleural effusion or pneumothorax. No
evidence of edema. No acute osseus abnormalities. Stigmata of DISH
within the thoracic spine.
IMPRESSION: No acute cardiopulmonary disease.

## 2020-07-03 DIAGNOSIS — Z780 Asymptomatic menopausal state: Secondary | ICD-10-CM | POA: Diagnosis not present

## 2020-07-03 DIAGNOSIS — M85851 Other specified disorders of bone density and structure, right thigh: Secondary | ICD-10-CM | POA: Diagnosis not present

## 2020-07-03 LAB — HM DEXA SCAN

## 2020-07-10 ENCOUNTER — Ambulatory Visit (INDEPENDENT_AMBULATORY_CARE_PROVIDER_SITE_OTHER): Payer: Medicare HMO

## 2020-07-10 ENCOUNTER — Other Ambulatory Visit: Payer: Self-pay

## 2020-07-10 ENCOUNTER — Ambulatory Visit: Payer: Medicare HMO | Admitting: Podiatry

## 2020-07-10 DIAGNOSIS — M722 Plantar fascial fibromatosis: Secondary | ICD-10-CM | POA: Diagnosis not present

## 2020-07-10 DIAGNOSIS — M79671 Pain in right foot: Secondary | ICD-10-CM

## 2020-07-10 MED ORDER — MELOXICAM 15 MG PO TABS: 15.0000 mg | ORAL_TABLET | Freq: Every day | ORAL | 0 refills | Status: DC

## 2020-07-10 NOTE — Patient Instructions (Signed)
For instructions on how to put on your Plantar Fascial Brace, please visit [email or web address]   Plantar Fasciitis (Heel Spur Syndrome) with Rehab The plantar fascia is a fibrous, ligament-like, soft-tissue structure that spans the bottom of the foot. Plantar fasciitis is a condition that causes pain in the foot due to inflammation of the tissue. SYMPTOMS   Pain and tenderness on the underneath side of the foot.  Pain that worsens with standing or walking. CAUSES  Plantar fasciitis is caused by irritation and injury to the plantar fascia on the underneath side of the foot. Common mechanisms of injury include:  Direct trauma to bottom of the foot.  Damage to a small nerve that runs under the foot where the main fascia attaches to the heel bone.  Stress placed on the plantar fascia due to bone spurs. RISK INCREASES WITH:   Activities that place stress on the plantar fascia (running, jumping, pivoting, or cutting).  Poor strength and flexibility.  Improperly fitted shoes.  Tight calf muscles.  Flat feet.  Failure to warm-up properly before activity.  Obesity. PREVENTION  Warm up and stretch properly before activity.  Allow for adequate recovery between workouts.  Maintain physical fitness:  Strength, flexibility, and endurance.  Cardiovascular fitness.  Maintain a health body weight.  Avoid stress on the plantar fascia.  Wear properly fitted shoes, including arch supports for individuals who have flat feet.  PROGNOSIS  If treated properly, then the symptoms of plantar fasciitis usually resolve without surgery. However, occasionally surgery is necessary.  RELATED COMPLICATIONS   Recurrent symptoms that may result in a chronic condition.  Problems of the lower back that are caused by compensating for the injury, such as limping.  Pain or weakness of the foot during push-off following surgery.  Chronic inflammation, scarring, and partial or complete  fascia tear, occurring more often from repeated injections.  TREATMENT  Treatment initially involves the use of ice and medication to help reduce pain and inflammation. The use of strengthening and stretching exercises may help reduce pain with activity, especially stretches of the Achilles tendon. These exercises may be performed at home or with a therapist. Your caregiver may recommend that you use heel cups of arch supports to help reduce stress on the plantar fascia. Occasionally, corticosteroid injections are given to reduce inflammation. If symptoms persist for greater than 6 months despite non-surgical (conservative), then surgery may be recommended.   MEDICATION   If pain medication is necessary, then nonsteroidal anti-inflammatory medications, such as aspirin and ibuprofen, or other minor pain relievers, such as acetaminophen, are often recommended.  Do not take pain medication within 7 days before surgery.  Prescription pain relievers may be given if deemed necessary by your caregiver. Use only as directed and only as much as you need.  Corticosteroid injections may be given by your caregiver. These injections should be reserved for the most serious cases, because they may only be given a certain number of times.  HEAT AND COLD  Cold treatment (icing) relieves pain and reduces inflammation. Cold treatment should be applied for 10 to 15 minutes every 2 to 3 hours for inflammation and pain and immediately after any activity that aggravates your symptoms. Use ice packs or massage the area with a piece of ice (ice massage).  Heat treatment may be used prior to performing the stretching and strengthening activities prescribed by your caregiver, physical therapist, or athletic trainer. Use a heat pack or soak the injury in warm water.  SEEK IMMEDIATE MEDICAL   CARE IF:  Treatment seems to offer no benefit, or the condition worsens.  Any medications produce adverse side effects.   EXERCISES- RANGE OF MOTION (ROM) AND STRETCHING EXERCISES - Plantar Fasciitis (Heel Spur Syndrome) These exercises may help you when beginning to rehabilitate your injury. Your symptoms may resolve with or without further involvement from your physician, physical therapist or athletic trainer. While completing these exercises, remember:   Restoring tissue flexibility helps normal motion to return to the joints. This allows healthier, less painful movement and activity.  An effective stretch should be held for at least 30 seconds.  A stretch should never be painful. You should only feel a gentle lengthening or release in the stretched tissue.  RANGE OF MOTION - Toe Extension, Flexion  Sit with your right / left leg crossed over your opposite knee.  Grasp your toes and gently pull them back toward the top of your foot. You should feel a stretch on the bottom of your toes and/or foot.  Hold this stretch for 10 seconds.  Now, gently pull your toes toward the bottom of your foot. You should feel a stretch on the top of your toes and or foot.  Hold this stretch for 10 seconds. Repeat  times. Complete this stretch 3 times per day.   RANGE OF MOTION - Ankle Dorsiflexion, Active Assisted  Remove shoes and sit on a chair that is preferably not on a carpeted surface.  Place right / left foot under knee. Extend your opposite leg for support.  Keeping your heel down, slide your right / left foot back toward the chair until you feel a stretch at your ankle or calf. If you do not feel a stretch, slide your bottom forward to the edge of the chair, while still keeping your heel down.  Hold this stretch for 10 seconds. Repeat 3 times. Complete this stretch 2 times per day.   STRETCH  Gastroc, Standing  Place hands on wall.  Extend right / left leg, keeping the front knee somewhat bent.  Slightly point your toes inward on your back foot.  Keeping your right / left heel on the floor and your  knee straight, shift your weight toward the wall, not allowing your back to arch.  You should feel a gentle stretch in the right / left calf. Hold this position for 10 seconds. Repeat 3 times. Complete this stretch 2 times per day.  STRETCH  Soleus, Standing  Place hands on wall.  Extend right / left leg, keeping the other knee somewhat bent.  Slightly point your toes inward on your back foot.  Keep your right / left heel on the floor, bend your back knee, and slightly shift your weight over the back leg so that you feel a gentle stretch deep in your back calf.  Hold this position for 10 seconds. Repeat 3 times. Complete this stretch 2 times per day.  STRETCH  Gastrocsoleus, Standing  Note: This exercise can place a lot of stress on your foot and ankle. Please complete this exercise only if specifically instructed by your caregiver.   Place the ball of your right / left foot on a step, keeping your other foot firmly on the same step.  Hold on to the wall or a rail for balance.  Slowly lift your other foot, allowing your body weight to press your heel down over the edge of the step.  You should feel a stretch in your right / left calf.  Hold this   position for 10 seconds.  Repeat this exercise with a slight bend in your right / left knee. Repeat 3 times. Complete this stretch 2 times per day.   STRENGTHENING EXERCISES - Plantar Fasciitis (Heel Spur Syndrome)  These exercises may help you when beginning to rehabilitate your injury. They may resolve your symptoms with or without further involvement from your physician, physical therapist or athletic trainer. While completing these exercises, remember:   Muscles can gain both the endurance and the strength needed for everyday activities through controlled exercises.  Complete these exercises as instructed by your physician, physical therapist or athletic trainer. Progress the resistance and repetitions only as guided.  STRENGTH -  Towel Curls  Sit in a chair positioned on a non-carpeted surface.  Place your foot on a towel, keeping your heel on the floor.  Pull the towel toward your heel by only curling your toes. Keep your heel on the floor. Repeat 3 times. Complete this exercise 2 times per day.  STRENGTH - Ankle Inversion  Secure one end of a rubber exercise band/tubing to a fixed object (table, pole). Loop the other end around your foot just before your toes.  Place your fists between your knees. This will focus your strengthening at your ankle.  Slowly, pull your big toe up and in, making sure the band/tubing is positioned to resist the entire motion.  Hold this position for 10 seconds.  Have your muscles resist the band/tubing as it slowly pulls your foot back to the starting position. Repeat 3 times. Complete this exercises 2 times per day.  Document Released: 02/03/2005 Document Revised: 04/28/2011 Document Reviewed: 05/18/2008 Exit[employer] Patient Information 2014 [employer]. 

## 2020-07-11 ENCOUNTER — Other Ambulatory Visit: Payer: Self-pay | Admitting: Nurse Practitioner

## 2020-07-17 NOTE — Progress Notes (Signed)
Subjective:   [name], female   DOB: 70 y.o.   MRN: [id number]   HPI 70 year old female presents the office with concerns of right heel pain which is been ongoing for last 2 to 3 months.  She states that the area is tender with prolonged standing.  She states that stretching her foot and heel is painful at times.  She does a lot of walking.  No recent injury.  No significant swelling.  No numbness or tingling.  She is tried ice with temporary relief.   Review of Systems  All other systems reviewed and are negative.  Past Medical History:  Diagnosis Date  . Abnormal Pap smear of cervix   . Hearing loss since childhood   bilateral hearing loss  . Hyperlipidemia    Phreesia 03/25/2020  . Irregular menses   . PONV (postoperative nausea and vomiting)   . SCC (squamous cell carcinoma), arm, right   . Skin lesion of left leg 12/08   atypical    Past Surgical History:  Procedure Laterality Date  . BREAST LUMPECTOMY Left 1995   benign  . COLONOSCOPY  07/04/2005   recheck in 10 years - Dr Collene Mares  . COLPOSCOPY  02/1996   benign  . COLPOSCOPY  12/1998   benign  . MASS EXCISION Right 11/18/2016   Procedure: EXCISIONAL BIOPSY MASS RIGHT LST WEB;  Surgeon: Daryll Brod, MD;  Location: [facility];  Service: Orthopedics;  Laterality: Right;  REG/FAB  . TUBAL LIGATION Bilateral 1991     Current Outpatient Medications:  .  meloxicam (MOBIC) 15 MG tablet, Take 1 tablet (15 mg total) by mouth daily., Disp: 30 tablet, Rfl: 0 .  acetaminophen (TYLENOL) 500 MG tablet, Take 500 mg by mouth every 6 (six) hours as needed., Disp: , Rfl:  .  B Complex-C (SUPER B COMPLEX PO), Take by mouth., Disp: , Rfl:  .  diazepam (VALIUM) 5 MG tablet, Take 1 tablet 1 hour before dental procedure, Disp: 3 tablet, Rfl: 0 .  ibuprofen (ADVIL,MOTRIN) 200 MG tablet, Take 200 mg by mouth every 6 (six) hours as needed., Disp: , Rfl:  .  Lactobacillus (PROBIOTIC ACIDOPHILUS PO), Take by  mouth., Disp: , Rfl:  .  Multiple Minerals-Vitamins (CALCIUM CITRATE PLUS PO), Take by mouth., Disp: , Rfl:  .  Multiple Vitamin (MULTIVITAMIN) tablet, Take 1 tablet by mouth daily., Disp: , Rfl:  .  Vitamin D, Cholecalciferol, 25 MCG (1000 UT) CAPS, Take 1 capsule by mouth daily., Disp: 60 capsule, Rfl:   No Known Allergies       Objective:  Physical Exam  General: AAO x3, NAD  Dermatological: Skin is warm, dry and supple bilateral.  There are no open sores, no preulcerative lesions, no rash or signs of infection present.  Vascular: Dorsalis Pedis artery and Posterior Tibial artery pedal pulses are 2/4 bilateral with immedate capillary fill time. There is no pain with calf compression, swelling, warmth, erythema.   Neruologic: Grossly intact via light touch bilateral.  Negative Tinel sign.  Musculoskeletal: Tenderness to palpation along the plantar medial tubercle of the calcaneus at the insertion of plantar fascia on the right foot. There is no pain along the course of the plantar fascia within the arch of the foot. Plantar fascia appears to be intact. There is no pain with lateral compression of the calcaneus or pain with vibratory sensation. There is no pain along the course or insertion of the achilles tendon. No other areas  of tenderness to bilateral lower extremities. Muscular strength 5/5 in all groups tested bilateral.  Gait: Unassisted, Nonantalgic.       Assessment:   Right heel pain, plan fasciitis     Plan:  -Treatment options discussed including all alternatives, risks, and complications -Etiology of symptoms were discussed -X-rays were obtained and reviewed with the patient.  There is no evidence of acute fracture or stress fracture identified today. -Discussed steroid injection we held off on this. -Prescribed mobic. Discussed side effects of the medication and directed to stop if any are to occur and call the office.  -Plantar fascial brace dispensed -Discussed  shoe modifications and inserts.  Trula Slade DPM

## 2020-07-19 ENCOUNTER — Ambulatory Visit: Payer: Medicare HMO | Admitting: Podiatry

## 2020-07-22 NOTE — Progress Notes (Signed)
Subjective:    [name], female    DOB: 05/07/1950, 70 y.o.   MRN: [id number]  HPI: Rebecca Howe is a 70 y.o. female presenting for  Chief Complaint  Patient presents with  . Follow-up    Is fasting  . Injections    TDaP   HYPERLIPIDEMIA Eat red meat 2 times weekly.  Does eat full fat dairy products a bit more frequently.  Eats fried foods infrequently.   Hyperlipidemia status: previously elevated Satisfied with current treatment?  Yes - currently taking 2 red yeast rice tablets daily Side effects: n/a Medication compliance: excellent Past cholesterol meds: red rice yeast - 2 tablets daily  Aspirin:  yes The 10-year ASCVD risk score Mikey Bussing DC Jr., et al., 2013) is: 6.9%   Values used to calculate the score:     Age: 86 years     Sex: Female     Is Non-Hispanic African American: No     Diabetic: No     Tobacco smoker: No     Systolic Blood Pressure: 350 mmHg     Is BP treated: No     HDL Cholesterol: 55 mg/dL     Total Cholesterol: 229 mg/dL Chest pain:  no Coronary artery disease:  yes Family history CAD:  yes - father diet of aneurysm age 40 Family history early CAD:  no  No Known Allergies  Outpatient Encounter Medications as of 07/23/2020  Medication Sig  . acetaminophen (TYLENOL) 500 MG tablet Take 500 mg by mouth every 6 (six) hours as needed.  . B Complex-C (SUPER B COMPLEX PO) Take by mouth.  Marland Kitchen CALCIUM PO Take by mouth.  Marland Kitchen ibuprofen (ADVIL,MOTRIN) 200 MG tablet Take 200 mg by mouth every 6 (six) hours as needed.  . Lactobacillus (PROBIOTIC ACIDOPHILUS PO) Take by mouth.  . meloxicam (MOBIC) 15 MG tablet Take 1 tablet (15 mg total) by mouth daily.  . Multiple Vitamin (MULTIVITAMIN) tablet Take 1 tablet by mouth daily.  . Red Yeast Rice Extract (RED YEAST RICE PO) Take by mouth.  . Vitamin D, Cholecalciferol, 25 MCG (1000 UT) CAPS Take 1 capsule by mouth daily.  . diazepam (VALIUM) 5 MG tablet Take 1 tablet 1 hour before dental procedure  (Patient not taking: Reported on 07/23/2020)  . [DISCONTINUED] Multiple Minerals-Vitamins (CALCIUM CITRATE PLUS PO) Take by mouth.   No facility-administered encounter medications on file as of 07/23/2020.    Patient Active Problem List   Diagnosis Date Noted  . Abdominal aortic ectasia ([place]) 03/28/2020  . Aortic atherosclerosis ([place]) 03/23/2018  . Hearing impaired person, bilateral 03/23/2018  . Osteopenia 09/04/2017  . Fibroma 11/26/2016  . Vitamin D deficiency 05/06/2013  . Hyperlipidemia 02/04/2013    Past Medical History:  Diagnosis Date  . Abnormal Pap smear of cervix   . Hearing loss since childhood   bilateral hearing loss  . Hyperlipidemia    Phreesia 03/25/2020  . Irregular menses   . PONV (postoperative nausea and vomiting)   . SCC (squamous cell carcinoma), arm, right   . Skin lesion of left leg 12/08   atypical    Relevant past medical, surgical, family and social history reviewed and updated as indicated. Interim medical history since our last visit reviewed.  Review of Systems Per HPI unless specifically indicated above     Objective:    BP 118/66   Pulse 68   Temp 98.4 F (36.9 C) (Temporal)   Resp 14   Ht  5\' 8"  (1.727 m)   Wt 169 lb (76.7 kg)   LMP 11/17/2001 (Approximate)   SpO2 97%   BMI 25.70 kg/m   Wt Readings from Last 3 Encounters:  07/23/20 169 lb (76.7 kg)  03/28/20 170 lb (77.1 kg)  03/28/19 169 lb (76.7 kg)    Physical Exam Vitals and nursing note reviewed.  Constitutional:      General: She is not in acute distress.    Appearance: Normal appearance. She is not toxic-appearing.  Neck:     Vascular: No carotid bruit.  Cardiovascular:     Rate and Rhythm: Normal rate and regular rhythm.     Heart sounds: Normal heart sounds. No murmur heard.   Pulmonary:     Effort: Pulmonary effort is normal.     Breath sounds: Normal breath sounds. No wheezing, rhonchi or rales.  Musculoskeletal:     Cervical back: Normal range of motion.   Skin:    General: Skin is warm and dry.     Coloration: Skin is not jaundiced or pale.     Findings: No erythema.  Neurological:     Mental Status: She is alert and oriented to person, place, and time.  Psychiatric:        Mood and Affect: Mood normal.        Behavior: Behavior normal.        Thought Content: Thought content normal.        Judgment: Judgment normal.       Assessment & Plan:   Problem List Items Addressed This Visit      Cardiovascular and Mediastinum   Aortic atherosclerosis ([place]) - Primary    Chronic, noted in 2018.  Last Lipid panel 03/2020 showed total cholesterol 229 and LDL 152.  Given atherosclerosis, we recommended statin, however patient is leery to start.  She has been taking red yeast rice 2 tablets daily and would prefer to treat with this for now.  Discussed The 10-year ASCVD risk score Mikey Bussing DC Brooke Bonito., et al., 2013) is: 6.9%   Values used to calculate the score:     Age: 68 years     Sex: Female     Is Non-Hispanic African American: No     Diabetic: No     Tobacco smoker: No     Systolic Blood Pressure: 299 mmHg     Is BP treated: No     HDL Cholesterol: 55 mg/dL     Total Cholesterol: 229 mg/dL   with her today.  She is fasting, so we will recheck lipid levels.  I encouraged statin, however patient remains leery and wishes to see how her cholesterol looks after taking red yeast rice.  Follow up pending lab work.      Relevant Orders   Lipid Panel     Other   Hyperlipidemia    Chronic.  Will recheck lipids today - patient is fasting.  Follow up pending lab work.      Relevant Orders   Lipid Panel    Other Visit Diagnoses    Need for tetanus, diphtheria, and acellular pertussis (Tdap) vaccine in patient of adolescent age or older       Relevant Orders   Tdap vaccine greater than or equal to 7yo IM (Completed)       Follow up plan: Return for pending lab work.

## 2020-07-23 ENCOUNTER — Other Ambulatory Visit: Payer: Self-pay

## 2020-07-23 ENCOUNTER — Ambulatory Visit: Payer: Medicare HMO | Admitting: Nurse Practitioner

## 2020-07-23 ENCOUNTER — Encounter: Payer: Self-pay | Admitting: Nurse Practitioner

## 2020-07-23 VITALS — BP 118/66 | HR 68 | Temp 98.4°F | Resp 14 | Ht 68.0 in | Wt 169.0 lb

## 2020-07-23 DIAGNOSIS — I700 Atherosclerosis of aorta: Secondary | ICD-10-CM | POA: Diagnosis not present

## 2020-07-23 DIAGNOSIS — E7800 Pure hypercholesterolemia, unspecified: Secondary | ICD-10-CM | POA: Diagnosis not present

## 2020-07-23 DIAGNOSIS — Z23 Encounter for immunization: Secondary | ICD-10-CM

## 2020-07-23 NOTE — Assessment & Plan Note (Signed)
Chronic, noted in 2018.  Last Lipid panel 03/2020 showed total cholesterol 229 and LDL 152.  Given atherosclerosis, we recommended statin, however patient is leery to start.  She has been taking red yeast rice 2 tablets daily and would prefer to treat with this for now.  Discussed The 10-year ASCVD risk score Mikey Bussing DC Brooke Bonito., et al., 2013) is: 6.9%   Values used to calculate the score:     Age: 70 years     Sex: Female     Is Non-Hispanic African American: No     Diabetic: No     Tobacco smoker: No     Systolic Blood Pressure: 479 mmHg     Is BP treated: No     HDL Cholesterol: 55 mg/dL     Total Cholesterol: 229 mg/dL   with her today.  She is fasting, so we will recheck lipid levels.  I encouraged statin, however patient remains leery and wishes to see how her cholesterol looks after taking red yeast rice.  Follow up pending lab work.

## 2020-07-23 NOTE — Assessment & Plan Note (Signed)
Chronic.  Will recheck lipids today - patient is fasting.  Follow up pending lab work.

## 2020-07-24 LAB — LIPID PANEL
Cholesterol: 220 mg/dL — ABNORMAL HIGH (ref <200)
HDL: 54 mg/dL (ref >=50)
LDL Cholesterol (Calc): 146 mg/dL (calc) — ABNORMAL HIGH
Non-HDL Cholesterol (Calc): 166 mg/dL (calc) — ABNORMAL HIGH (ref <130)
Total CHOL/HDL Ratio: 4.1 (calc) (ref <5.0)
Triglycerides: 93 mg/dL (ref <150)

## 2020-07-25 ENCOUNTER — Encounter: Payer: Self-pay | Admitting: Nurse Practitioner

## 2020-07-26 DIAGNOSIS — Z1211 Encounter for screening for malignant neoplasm of colon: Secondary | ICD-10-CM | POA: Diagnosis not present

## 2020-07-26 DIAGNOSIS — Z800 Family history of malignant neoplasm of digestive organs: Secondary | ICD-10-CM | POA: Diagnosis not present

## 2020-07-26 DIAGNOSIS — R194 Change in bowel habit: Secondary | ICD-10-CM | POA: Diagnosis not present

## 2020-08-23 DIAGNOSIS — D125 Benign neoplasm of sigmoid colon: Secondary | ICD-10-CM | POA: Diagnosis not present

## 2020-08-23 DIAGNOSIS — Z800 Family history of malignant neoplasm of digestive organs: Secondary | ICD-10-CM | POA: Diagnosis not present

## 2020-08-23 DIAGNOSIS — Z1211 Encounter for screening for malignant neoplasm of colon: Secondary | ICD-10-CM | POA: Diagnosis not present

## 2020-08-23 DIAGNOSIS — K635 Polyp of colon: Secondary | ICD-10-CM | POA: Diagnosis not present

## 2020-08-23 DIAGNOSIS — Z86010 Personal history of colonic polyps: Secondary | ICD-10-CM | POA: Diagnosis not present

## 2020-08-23 LAB — HM COLONOSCOPY

## 2020-08-23 NOTE — Progress Notes (Signed)
70 y.o. G56P2002 Married Caucasian female here for annual exam.    Had Covid in July, 2020.  No vaginal bleeding.  No problems today.  PCP:   Noemi Chapel, NP  Patient's last menstrual period was 11/17/2001 (approximate).           Sexually active: Yes.    The current method of family planning is tubal ligation.    Exercising: Yes.    Home exercise. Smoker:  no  Health Maintenance: Pap:  04-01-17 normal neg HPV History of abnormal Pap:  yes, colposcopy done for LGSIL pap, colpo biopsy was [facility] - benign 12/27/1998. MMG:  10-11-19 normal - [facility].  Colonoscopy:  2022 polyp removed.   Has a hemorrhoid now following the colonoscopy.  BMD:   07-03-20  Result  Low bone mass TDaP:  2022 Gardasil:   no HIV:neg Hep C:neg Screening Labs:  Hb today: PCP Urine today: PCP   reports that she has never smoked. She has never used smokeless tobacco. She reports that she does not drink alcohol and does not use drugs.  Past Medical History:  Diagnosis Date   Abnormal Pap smear of cervix    COVID 08/2018   Hearing loss since childhood   bilateral hearing loss   Hyperlipidemia    Phreesia 03/25/2020   Irregular menses    PONV (postoperative nausea and vomiting)    SCC (squamous cell carcinoma), arm, right    Skin lesion of left leg 01/2007   atypical    Past Surgical History:  Procedure Laterality Date   BREAST LUMPECTOMY Left 1995   benign   COLONOSCOPY  07/04/2005   recheck in 10 years - Dr Collene Mares   COLPOSCOPY  02/1996   benign   COLPOSCOPY  12/1998   benign   MASS EXCISION Right 11/18/2016   Procedure: EXCISIONAL BIOPSY MASS RIGHT LST WEB;  Surgeon: Daryll Brod, MD;  Location: [facility];  Service: Orthopedics;  Laterality: Right;  REG/FAB   TUBAL LIGATION Bilateral 1991    Current Outpatient Medications  Medication Sig Dispense Refill   acetaminophen (TYLENOL) 500 MG tablet Take 500 mg by mouth every 6 (six) hours as needed.     CALCIUM PO Take by mouth.      ibuprofen (ADVIL,MOTRIN) 200 MG tablet Take 200 mg by mouth every 6 (six) hours as needed.     Lactobacillus (PROBIOTIC ACIDOPHILUS PO) Take by mouth.     Multiple Vitamin (MULTIVITAMIN) tablet Take 1 tablet by mouth daily.     Red Yeast Rice Extract (RED YEAST RICE PO) Take by mouth.     Vitamin D, Cholecalciferol, 25 MCG (1000 UT) CAPS Take 1 capsule by mouth daily. 60 capsule    diazepam (VALIUM) 5 MG tablet Take 1 tablet 1 hour before dental procedure (Patient not taking: No sig reported) 3 tablet 0   No current facility-administered medications for this visit.    Family History  Problem Relation Age of Onset   Hypertension Father    Diabetes Paternal Grandmother    Hypertension Mother    Thyroid disease Mother    Hypertension Brother    Cancer Paternal Aunt 28       colon cancer   Breast cancer Paternal Aunt     Review of Systems  All other systems reviewed and are negative.  Exam:   BP 120/78 (BP Location: Right Arm, Patient Position: Sitting, Cuff Size: Normal)   Pulse 74   Ht _0  (1.702 m)   Wt  167 lb (75.8 kg)   LMP 11/17/2001 (Approximate)   SpO2 98%   BMI 26.16 kg/m     General appearance: alert, cooperative and appears stated age Head: normocephalic, without obvious abnormality, atraumatic Neck: no adenopathy, supple, symmetrical, trachea midline and thyroid normal to inspection and palpation Lungs: clear to auscultation bilaterally Breasts: normal appearance, no masses or tenderness, No nipple retraction or dimpling, No nipple discharge or bleeding, No axillary adenopathy Heart: regular rate and rhythm Abdomen: soft, non-tender; no masses, no organomegaly Extremities: extremities normal, atraumatic, no cyanosis or edema Skin: skin color, texture, turgor normal. No rashes or lesions Lymph nodes: cervical, supraclavicular, and axillary nodes normal. Neurologic: grossly normal  Pelvic: External genitalia:  no lesions              No abnormal inguinal nodes  palpated.              Urethra:  normal appearing urethra with no masses, tenderness or lesions              Bartholins and Skenes: normal                 Vagina: normal appearing vagina with normal color and discharge, no lesions              Cervix: no lesions              Pap taken: yes. Bimanual Exam:  Uterus:  normal size, contour, position, consistency, mobility, non-tender              Adnexa: no mass, fullness, tenderness              Rectal exam: declines.  Assessment:   Status post benign left breast biopsy.  Hx abnormal pap. Osteopenia.  Followed by PCP.   Plan: Mammogram screening discussed. Self breast awareness reviewed. Pap and HR HPV as above. Guidelines for Calcium, Vitamin D, regular exercise program including cardiovascular and weight bearing exercise. BMD in 2 years.   Follow up annually and prn.   After visit summary provided.

## 2020-08-30 ENCOUNTER — Encounter: Payer: Self-pay | Admitting: Obstetrics and Gynecology

## 2020-08-30 ENCOUNTER — Ambulatory Visit (INDEPENDENT_AMBULATORY_CARE_PROVIDER_SITE_OTHER): Payer: Medicare HMO | Admitting: Obstetrics and Gynecology

## 2020-08-30 ENCOUNTER — Other Ambulatory Visit: Payer: Self-pay

## 2020-08-30 ENCOUNTER — Other Ambulatory Visit (HOSPITAL_COMMUNITY)
Admission: RE | Admit: 2020-08-30 | Discharge: 2020-08-30 | Disposition: A | Discharge status: Home / Self Care | Payer: Medicare HMO | Source: Ambulatory Visit | Attending: Obstetrics and Gynecology | Admitting: Obstetrics and Gynecology

## 2020-08-30 VITALS — BP 120/78 | HR 74 | Ht 67.0 in | Wt 167.0 lb

## 2020-08-30 DIAGNOSIS — Z124 Encounter for screening for malignant neoplasm of cervix: Secondary | ICD-10-CM

## 2020-08-30 DIAGNOSIS — Z01419 Encounter for gynecological examination (general) (routine) without abnormal findings: Secondary | ICD-10-CM | POA: Diagnosis not present

## 2020-08-30 DIAGNOSIS — Z1239 Encounter for other screening for malignant neoplasm of breast: Secondary | ICD-10-CM

## 2020-08-30 NOTE — Patient Instructions (Signed)

## 2020-09-04 LAB — CYTOLOGY - PAP: Diagnosis: NEGATIVE

## 2020-10-16 ENCOUNTER — Encounter: Payer: Self-pay | Admitting: Nurse Practitioner

## 2020-10-16 DIAGNOSIS — Z1231 Encounter for screening mammogram for malignant neoplasm of breast: Secondary | ICD-10-CM | POA: Diagnosis not present

## 2020-10-30 DIAGNOSIS — H5203 Hypermetropia, bilateral: Secondary | ICD-10-CM | POA: Diagnosis not present

## 2020-10-30 DIAGNOSIS — H04123 Dry eye syndrome of bilateral lacrimal glands: Secondary | ICD-10-CM | POA: Diagnosis not present

## 2020-10-30 DIAGNOSIS — H524 Presbyopia: Secondary | ICD-10-CM | POA: Diagnosis not present

## 2020-10-30 DIAGNOSIS — H40053 Ocular hypertension, bilateral: Secondary | ICD-10-CM | POA: Diagnosis not present

## 2020-10-30 DIAGNOSIS — H2513 Age-related nuclear cataract, bilateral: Secondary | ICD-10-CM | POA: Diagnosis not present

## 2020-11-20 DIAGNOSIS — H903 Sensorineural hearing loss, bilateral: Secondary | ICD-10-CM | POA: Diagnosis not present

## 2020-11-21 ENCOUNTER — Other Ambulatory Visit: Payer: Self-pay

## 2020-11-21 ENCOUNTER — Ambulatory Visit (INDEPENDENT_AMBULATORY_CARE_PROVIDER_SITE_OTHER): Payer: Medicare HMO | Admitting: *Deleted

## 2020-11-21 DIAGNOSIS — Z23 Encounter for immunization: Secondary | ICD-10-CM

## 2021-02-14 ENCOUNTER — Telehealth: Payer: Medicare HMO | Admitting: Nurse Practitioner

## 2021-07-04 ENCOUNTER — Ambulatory Visit (INDEPENDENT_AMBULATORY_CARE_PROVIDER_SITE_OTHER): Payer: Medicare HMO | Admitting: Nurse Practitioner

## 2021-07-04 ENCOUNTER — Encounter: Payer: Self-pay | Admitting: Nurse Practitioner

## 2021-07-04 VITALS — BP 142/90 | HR 72 | Temp 98.5°F | Ht 67.0 in | Wt 163.2 lb

## 2021-07-04 DIAGNOSIS — R030 Elevated blood-pressure reading, without diagnosis of hypertension: Secondary | ICD-10-CM | POA: Diagnosis not present

## 2021-07-04 DIAGNOSIS — E785 Hyperlipidemia, unspecified: Secondary | ICD-10-CM | POA: Diagnosis not present

## 2021-07-04 DIAGNOSIS — E559 Vitamin D deficiency, unspecified: Secondary | ICD-10-CM | POA: Diagnosis not present

## 2021-07-04 DIAGNOSIS — I77811 Abdominal aortic ectasia: Secondary | ICD-10-CM

## 2021-07-04 LAB — COMPREHENSIVE METABOLIC PANEL
ALT: 16 U/L (ref 0–35)
AST: 27 U/L (ref 0–37)
Albumin: 4.4 g/dL (ref 3.5–5.2)
Alkaline Phosphatase: 83 U/L (ref 39–117)
BUN: 12 mg/dL (ref 6–23)
CO2: 32 mEq/L (ref 19–32)
Calcium: 9.9 mg/dL (ref 8.4–10.5)
Chloride: 103 mEq/L (ref 96–112)
Creatinine, Ser: 0.87 mg/dL (ref 0.40–1.20)
GFR: 67.8 mL/min (ref >60.00)
Glucose, Bld: 92 mg/dL (ref 70–99)
Potassium: 4.4 mEq/L (ref 3.5–5.1)
Sodium: 141 mEq/L (ref 135–145)
Total Bilirubin: 0.4 mg/dL (ref 0.2–1.2)
Total Protein: 7.3 g/dL (ref 6.0–8.3)

## 2021-07-04 LAB — CBC
HCT: 39.7 % (ref 36.0–46.0)
Hemoglobin: 13.1 g/dL (ref 12.0–15.0)
MCHC: 33.1 g/dL (ref 30.0–36.0)
MCV: 88 fl (ref 78.0–100.0)
Platelets: 212 10*3/uL (ref 150.0–400.0)
RBC: 4.52 Mil/uL (ref 3.87–5.11)
RDW: 13.7 % (ref 11.5–15.5)
WBC: 4.6 10*3/uL (ref 4.0–10.5)

## 2021-07-04 LAB — LIPID PANEL
Cholesterol: 214 mg/dL — ABNORMAL HIGH (ref 0–200)
HDL: 52.4 mg/dL (ref >39.00)
LDL Cholesterol: 145 mg/dL — ABNORMAL HIGH (ref 0–99)
NonHDL: 161.7
Total CHOL/HDL Ratio: 4
Triglycerides: 83 mg/dL (ref 0.0–149.0)
VLDL: 16.6 mg/dL (ref 0.0–40.0)

## 2021-07-04 LAB — HEMOGLOBIN A1C: Hgb A1c MFr Bld: 5.6 % (ref 4.6–6.5)

## 2021-07-04 LAB — VITAMIN D 25 HYDROXY (VIT D DEFICIENCY, FRACTURES): VITD: 40.63 ng/mL (ref 30.00–100.00)

## 2021-07-04 LAB — TSH: TSH: 2.41 u[IU]/mL (ref 0.35–5.50)

## 2021-07-04 NOTE — Assessment & Plan Note (Signed)
Blood pressure elevated today, has been normal at previous visits.  For now we will not initiate antihypertensive, however will monitor closely.  She was also encouraged to get at home blood pressure cuff to monitor her blood pressure at home.  Follow-up in 1 month for close monitoring.

## 2021-07-04 NOTE — Progress Notes (Signed)
Subjective:  [name], female    DOB: 1950/09/14  Age: 71 y.o. MRN: [id number]  CC:  Chief Complaint  Patient presents with   New Patient (Initial Visit)      HPI  This patient arrives today for the above.  Her previous primary care provider left her office she is establishing here for new primary care provider.  Overall she feels well.  She has a history of hyperlipidemia for which she takes red yeast rice as well as vitamin D deficiency for which she takes vitamin D3 2000 IUs by mouth daily.  She is up-to-date on mammogram, colon cancer screening, and follows up with GYN for cervical cancer screening.  Per chart review I do see a history of abdominal aortic ectasia, she will be due to have a repeat ultrasound in December of this year.  She has no acute concerns today.  Past Medical History:  Diagnosis Date   Abnormal Pap smear of cervix    COVID 08/2018   Hearing loss since childhood   bilateral hearing loss   Hyperlipidemia    Phreesia 03/25/2020   Irregular menses    PONV (postoperative nausea and vomiting)    SCC (squamous cell carcinoma), arm, right    Skin lesion of left leg 01/2007   atypical      Family History  Problem Relation Age of Onset   Hypertension Father    Diabetes Paternal Grandmother    Hypertension Mother    Thyroid disease Mother    Hypertension Brother    Cancer Paternal Aunt 88       colon cancer   Breast cancer Paternal Aunt     Social History   Social History Narrative   ** Merged History Encounter **       Social History   Tobacco Use   Smoking status: Never   Smokeless tobacco: Never  Substance Use Topics   Alcohol use: No     Current Meds  Medication Sig   acetaminophen (TYLENOL) 500 MG tablet Take 500 mg by mouth every 6 (six) hours as needed.   CALCIUM PO Take by mouth.   diazepam (VALIUM) 5 MG tablet Take 1 tablet 1 hour before dental procedure   ibuprofen (ADVIL,MOTRIN) 200 MG tablet  Take 200 mg by mouth every 6 (six) hours as needed.   Lactobacillus (PROBIOTIC ACIDOPHILUS PO) Take by mouth.   Multiple Vitamin (MULTIVITAMIN) tablet Take 1 tablet by mouth daily.   Red Yeast Rice Extract (RED YEAST RICE PO) Take by mouth.   Vitamin D, Cholecalciferol, 25 MCG (1000 UT) CAPS Take 1 capsule by mouth daily. (Patient taking differently: Take 2 capsules by mouth daily.)    ROS:  Review of Systems  Constitutional:  Negative for fever, malaise/fatigue and weight loss.  Respiratory:  Negative for shortness of breath.   Cardiovascular:  Negative for chest pain.  Gastrointestinal:  Negative for abdominal pain and blood in stool.  Neurological:  Negative for dizziness and loss of consciousness.    Objective:   Today's Vitals: BP (!) 142/90 (BP Location: Left Arm, Patient Position: Sitting, Cuff Size: Normal)   Pulse 72   Temp 98.5 F (36.9 C) (Oral)   Ht '5\' 7"'$  (1.702 m)   Wt 163 lb 3.2 oz (74 kg)   LMP 11/17/2001 (Approximate)   SpO2 98%   BMI 25.56 kg/m     07/04/2021    9:58 AM 08/30/2020    9:22 AM 07/23/2020  10:57 AM  Vitals with BMI  Height '5\' 7"'$  '5\' 7"'$  '5\' 8"'$   Weight 163 lbs 3 oz 167 lbs 169 lbs  BMI 25.55 64.33 29.5  Systolic 188 416 606  Diastolic 90 78 66  Pulse 72 74 68     Physical Exam Vitals reviewed.  Constitutional:      General: She is not in acute distress.    Appearance: Normal appearance.  HENT:     Head: Normocephalic and atraumatic.  Neck:     Vascular: No carotid bruit.  Cardiovascular:     Rate and Rhythm: Normal rate and regular rhythm.     Pulses: Normal pulses.     Heart sounds: Normal heart sounds.  Pulmonary:     Effort: Pulmonary effort is normal.     Breath sounds: Normal breath sounds.  Skin:    General: Skin is warm and dry.  Neurological:     General: No focal deficit present.     Mental Status: She is alert and oriented to person, place, and time.  Psychiatric:        Mood and Affect: Mood normal.        Behavior:  Behavior normal.        Judgment: Judgment normal.         Assessment and Plan   1. Hyperlipidemia, unspecified hyperlipidemia type   2. Abdominal aortic ectasia ([place])   3. Vitamin D deficiency   4. Elevated blood pressure reading      Plan: See plan via problem list below.   Tests ordered Orders Placed This Encounter  Procedures   Hemoglobin A1c   Lipid panel   Comprehensive metabolic panel   CBC   TSH   Vitamin D (25 hydroxy)      No orders of the defined types were placed in this encounter.   Patient to follow-up in 1 month or sooner as needed.  Ailene Ards, NP

## 2021-07-04 NOTE — Assessment & Plan Note (Signed)
Chronic, will check serum vitamin D level and metabolic panel to monitor calcium level for further evaluation.  Further recommendations may be made based upon these results.

## 2021-07-04 NOTE — Assessment & Plan Note (Addendum)
Chronic, patient continues on red yeast rice.  She is fasting today so we will check lipid panel as last panel was collected a little over a year ago.  Further recommendations may be made based upon these results.

## 2021-07-04 NOTE — Assessment & Plan Note (Signed)
Patient will be due for abdominal ultrasound to be repeated in December this year.  Reminder sent to myself in the system to order this closer to when this is due.  She was also encouraged to let me know if she does not hear about getting appointment scheduled for ultrasound in December.  She reports understanding.

## 2021-08-08 ENCOUNTER — Ambulatory Visit (INDEPENDENT_AMBULATORY_CARE_PROVIDER_SITE_OTHER): Payer: Medicare HMO | Admitting: Nurse Practitioner

## 2021-08-08 ENCOUNTER — Telehealth: Payer: Self-pay | Admitting: Nurse Practitioner

## 2021-08-08 ENCOUNTER — Other Ambulatory Visit: Payer: Self-pay | Admitting: Nurse Practitioner

## 2021-08-08 VITALS — BP 118/70 | HR 65 | Temp 98.3°F | Resp 18 | Ht 67.0 in | Wt 163.4 lb

## 2021-08-08 DIAGNOSIS — R944 Abnormal results of kidney function studies: Secondary | ICD-10-CM

## 2021-08-08 DIAGNOSIS — E7800 Pure hypercholesterolemia, unspecified: Secondary | ICD-10-CM | POA: Diagnosis not present

## 2021-08-08 DIAGNOSIS — Z1231 Encounter for screening mammogram for malignant neoplasm of breast: Secondary | ICD-10-CM | POA: Diagnosis not present

## 2021-08-08 DIAGNOSIS — Z0001 Encounter for general adult medical examination with abnormal findings: Secondary | ICD-10-CM | POA: Insufficient documentation

## 2021-08-08 NOTE — Telephone Encounter (Signed)
Thank you :)

## 2021-08-08 NOTE — Assessment & Plan Note (Signed)
Screening mammogram ordered today.  Further recommendations may be made based on these results.

## 2021-08-08 NOTE — Progress Notes (Signed)
Established Patient Office Visit  Subjective   [name], female    DOB: Feb 13, 1951  Age: 71 y.o. MRN: [id number]  Chief Complaint  Patient presents with   Annual Exam    Patient arrives today for the above.  Health maintenance: Up-to-date with vaccines, mammogram, DEXA scan, and colon cancer screening.  Patient is also been screened for hepatitis C.  She will be due for mammogram September 1 of this year.  Labs were done at last office visit which showed hyperlipidemia.  Patient continues on red yeast rice.  She also had mildly reduced GFR at 67.  Otherwise lab work showed normal electrolytes, normal liver enzymes, normal vitamin D level, no anemia, normal A1c, and normal TSH.  She has no acute concerns today.    Review of Systems  Constitutional:  Negative for fever, malaise/fatigue and weight loss.  Respiratory:  Negative for cough, sputum production and shortness of breath.   Cardiovascular:  Negative for chest pain and palpitations.  Gastrointestinal:  Negative for abdominal pain and blood in stool.  Neurological:  Negative for dizziness and headaches.  Psychiatric/Behavioral:  Negative for depression and suicidal ideas.       Objective:     BP 118/70 (BP Location: Left Arm, Patient Position: Sitting, Cuff Size: Normal)   Pulse 65   Temp 98.3 F (36.8 C) (Oral)   Resp 18   Ht '5\' 7"'$  (1.702 m)   Wt 163 lb 6.4 oz (74.1 kg)   LMP 11/17/2001 (Approximate)   SpO2 96%   BMI 25.59 kg/m    Physical Exam Vitals reviewed.  Constitutional:      Appearance: Normal appearance.  HENT:     Head: Normocephalic and atraumatic.     Right Ear: Tympanic membrane, ear canal and external ear normal.     Left Ear: Tympanic membrane, ear canal and external ear normal.  Eyes:     General:        Right eye: No discharge.        Left eye: No discharge.     Extraocular Movements: Extraocular movements intact.     Conjunctiva/sclera: Conjunctivae normal.      Pupils: Pupils are equal, round, and reactive to light.  Neck:     Vascular: No carotid bruit.  Cardiovascular:     Rate and Rhythm: Normal rate and regular rhythm.     Pulses: Normal pulses.     Heart sounds: Normal heart sounds. No murmur heard. Pulmonary:     Effort: Pulmonary effort is normal.     Breath sounds: Normal breath sounds.  Chest:     Comments: Deferred per patient preferences. She states because she is planning on mammogram soon she will defer the breast exam today.  Abdominal:     General: Abdomen is flat. Bowel sounds are normal. There is no distension.     Palpations: Abdomen is soft. There is no mass.     Tenderness: There is no abdominal tenderness.  Musculoskeletal:        General: No tenderness.     Cervical back: Neck supple. No muscular tenderness.     Right lower leg: No edema.     Left lower leg: No edema.  Lymphadenopathy:     Cervical: No cervical adenopathy.  Skin:    General: Skin is warm and dry.  Neurological:     General: No focal deficit present.     Mental Status: She is alert and oriented to person, place,  and time.     Motor: No weakness.     Gait: Gait normal.  Psychiatric:        Mood and Affect: Mood normal.        Behavior: Behavior normal.        Judgment: Judgment normal.      No results found for any visits on 08/08/21.    The 10-year ASCVD risk score (Arnett DK, et al., 2019) is: 7.6%    Assessment & Plan:   Problem List Items Addressed This Visit       Other   Hyperlipidemia    Chronic, patient continues on red yeast rice. ASCVD <10%. Patient to continue taking her red yeast rice and focus on lifestyle modification with diet and exercise. We did discuss possibly adding zetia to her regimen to further reduce LDL, but per shared decision making patient would like to hold of on adding additional medication at this time.       Decreased calculated GFR    Patient notified of mildly reduced GFR.  Patient encouraged to  focus on hydration, blood pressure normal today.  Patient will follow-up in 3 months to repeat GFR and check for albuminuria.  Further recommendations may be made based on these results.        Encounter for screening mammogram for malignant neoplasm of breast    Screening mammogram ordered today.  Further recommendations may be made based on these results.      Relevant Orders   MM DIGITAL SCREENING BILATERAL   Encounter for general adult medical examination with abnormal findings - Primary    Screening mammogram ordered today.  Patient up-to-date with other routine screenings and immunizations.  Patient encouraged to follow a healthy lifestyle focusing on hydration and diet.       Return in about 6 months (around 02/07/2022) for Follow-up with Judson Roch; labs in 3 months.    Ailene Ards, NP

## 2021-08-08 NOTE — Assessment & Plan Note (Signed)
Chronic, patient continues on red yeast rice. ASCVD <10%. Patient to continue taking her red yeast rice and focus on lifestyle modification with diet and exercise. We did discuss possibly adding zetia to her regimen to further reduce LDL, but per shared decision making patient would like to hold of on adding additional medication at this time.

## 2021-08-08 NOTE — Assessment & Plan Note (Signed)
Patient notified of mildly reduced GFR.  Patient encouraged to focus on hydration, blood pressure normal today.  Patient will follow-up in 3 months to repeat GFR and check for albuminuria.  Further recommendations may be made based on these results.

## 2021-08-08 NOTE — Telephone Encounter (Signed)
PT called in the schedule the her labs but no orders are in yet.   Need lab orders put in so she can schedule appointment.   Please advise.

## 2021-08-08 NOTE — Assessment & Plan Note (Signed)
Screening mammogram ordered today.  Patient up-to-date with other routine screenings and immunizations.  Patient encouraged to follow a healthy lifestyle focusing on hydration and diet.

## 2021-08-15 DIAGNOSIS — Z85828 Personal history of other malignant neoplasm of skin: Secondary | ICD-10-CM | POA: Diagnosis not present

## 2021-08-15 DIAGNOSIS — L570 Actinic keratosis: Secondary | ICD-10-CM | POA: Diagnosis not present

## 2021-08-15 DIAGNOSIS — D1801 Hemangioma of skin and subcutaneous tissue: Secondary | ICD-10-CM | POA: Diagnosis not present

## 2021-08-15 DIAGNOSIS — L821 Other seborrheic keratosis: Secondary | ICD-10-CM | POA: Diagnosis not present

## 2021-08-15 DIAGNOSIS — L814 Other melanin hyperpigmentation: Secondary | ICD-10-CM | POA: Diagnosis not present

## 2021-08-15 DIAGNOSIS — D2372 Other benign neoplasm of skin of left lower limb, including hip: Secondary | ICD-10-CM | POA: Diagnosis not present

## 2021-08-15 DIAGNOSIS — D485 Neoplasm of uncertain behavior of skin: Secondary | ICD-10-CM | POA: Diagnosis not present

## 2021-08-15 DIAGNOSIS — D220 Melanocytic nevi of lip: Secondary | ICD-10-CM | POA: Diagnosis not present

## 2021-08-15 DIAGNOSIS — L720 Epidermal cyst: Secondary | ICD-10-CM | POA: Diagnosis not present

## 2021-08-22 ENCOUNTER — Encounter: Payer: Self-pay | Admitting: Nurse Practitioner

## 2021-10-29 DIAGNOSIS — Z1231 Encounter for screening mammogram for malignant neoplasm of breast: Secondary | ICD-10-CM | POA: Diagnosis not present

## 2021-10-29 LAB — HM MAMMOGRAPHY

## 2021-11-05 DIAGNOSIS — H5203 Hypermetropia, bilateral: Secondary | ICD-10-CM | POA: Diagnosis not present

## 2021-11-05 DIAGNOSIS — H43813 Vitreous degeneration, bilateral: Secondary | ICD-10-CM | POA: Diagnosis not present

## 2021-11-06 ENCOUNTER — Telehealth: Payer: Self-pay | Admitting: Nurse Practitioner

## 2021-11-06 NOTE — Telephone Encounter (Signed)
LVM for pt to rtn my call to schedule AWV with [facility] call back # [phone] 

## 2021-11-11 ENCOUNTER — Other Ambulatory Visit: Payer: Medicare HMO

## 2021-11-12 ENCOUNTER — Ambulatory Visit (INDEPENDENT_AMBULATORY_CARE_PROVIDER_SITE_OTHER): Payer: Medicare HMO

## 2021-11-12 ENCOUNTER — Other Ambulatory Visit (INDEPENDENT_AMBULATORY_CARE_PROVIDER_SITE_OTHER): Payer: Medicare HMO

## 2021-11-12 DIAGNOSIS — Z23 Encounter for immunization: Secondary | ICD-10-CM

## 2021-11-12 DIAGNOSIS — R944 Abnormal results of kidney function studies: Secondary | ICD-10-CM

## 2021-11-12 LAB — COMPREHENSIVE METABOLIC PANEL
ALT: 16 U/L (ref 0–35)
AST: 22 U/L (ref 0–37)
Albumin: 4.1 g/dL (ref 3.5–5.2)
Alkaline Phosphatase: 73 U/L (ref 39–117)
BUN: 11 mg/dL (ref 6–23)
CO2: 32 mEq/L (ref 19–32)
Calcium: 9.7 mg/dL (ref 8.4–10.5)
Chloride: 103 mEq/L (ref 96–112)
Creatinine, Ser: 0.89 mg/dL (ref 0.40–1.20)
GFR: 65.81 mL/min (ref >60.00)
Glucose, Bld: 86 mg/dL (ref 70–99)
Potassium: 4.4 mEq/L (ref 3.5–5.1)
Sodium: 139 mEq/L (ref 135–145)
Total Bilirubin: 0.4 mg/dL (ref 0.2–1.2)
Total Protein: 7.1 g/dL (ref 6.0–8.3)

## 2021-11-12 LAB — MICROALBUMIN / CREATININE URINE RATIO
Creatinine,U: 27.4 mg/dL
Microalb Creat Ratio: 2.6 mg/g (ref 0.0–30.0)
Microalb, Ur: <0.7 mg/dL (ref 0.0–1.9)

## 2021-11-18 ENCOUNTER — Ambulatory Visit (INDEPENDENT_AMBULATORY_CARE_PROVIDER_SITE_OTHER): Payer: Medicare HMO

## 2021-11-18 VITALS — Ht 67.0 in | Wt 160.0 lb

## 2021-11-18 DIAGNOSIS — Z0000 Encounter for general adult medical examination without abnormal findings: Secondary | ICD-10-CM | POA: Diagnosis not present

## 2021-11-18 NOTE — Patient Instructions (Signed)
Rebecca Howe , Thank you for taking time to come for your Medicare Wellness Visit. I appreciate your ongoing commitment to your health goals. Please review the following plan we discussed and let me know if I can assist you in the future.   These are the goals we discussed:  Goals      DIET - INCREASE WATER INTAKE        This is a list of the screening recommended for you and due dates:  Health Maintenance  Topic Date Due   DEXA scan (bone density measurement)  07/04/2022   Mammogram  10/30/2022   Colon Cancer Screening  08/23/2025   Tetanus Vaccine  07/24/2030   Pneumonia Vaccine  Completed   Flu Shot  Completed   Hepatitis C Screening: USPSTF Recommendation to screen - Ages 18-79 yo.  Completed   Zoster (Shingles) Vaccine  Completed   HPV Vaccine  Aged Out   COVID-19 Vaccine  Discontinued    Advanced directives: Please bring a copy of your health care power of attorney and living will to the office to be added to your chart at your convenience.   Conditions/risks identified: Aim for 30 minutes of exercise or brisk walking, 6-8 glasses of water, and 5 servings of fruits and vegetables each day.   Next appointment: Follow up in one year for your annual wellness visit    Preventive Care 65 Years and Older, Female Preventive care refers to lifestyle choices and visits with your health care provider that can promote health and wellness. What does preventive care include? A yearly physical exam. This is also called an annual well check. Dental exams once or twice a year. Routine eye exams. Ask your health care provider how often you should have your eyes checked. Personal lifestyle choices, including: Daily care of your teeth and gums. Regular physical activity. Eating a healthy diet. Avoiding tobacco and drug use. Limiting alcohol use. Practicing safe sex. Taking low-dose aspirin every day. Taking vitamin and mineral supplements as recommended by your health care  provider. What happens during an annual well check? The services and screenings done by your health care provider during your annual well check will depend on your age, overall health, lifestyle risk factors, and family history of disease. Counseling  Your health care provider may ask you questions about your: Alcohol use. Tobacco use. Drug use. Emotional well-being. Home and relationship well-being. Sexual activity. Eating habits. History of falls. Memory and ability to understand (cognition). Work and work Statistician. Reproductive health. Screening  You may have the following tests or measurements: Height, weight, and BMI. Blood pressure. Lipid and cholesterol levels. These may be checked every 5 years, or more frequently if you are over 11 years old. Skin check. Lung cancer screening. You may have this screening every year starting at age 28 if you have a 30-pack-year history of smoking and currently smoke or have quit within the past 15 years. Fecal occult blood test (FOBT) of the stool. You may have this test every year starting at age 59. Flexible sigmoidoscopy or colonoscopy. You may have a sigmoidoscopy every 5 years or a colonoscopy every 10 years starting at age 46. Hepatitis C blood test. Hepatitis B blood test. Sexually transmitted disease (STD) testing. Diabetes screening. This is done by checking your blood sugar (glucose) after you have not eaten for a while (fasting). You may have this done every 1-3 years. Bone density scan. This is done to screen for osteoporosis. You may have this done  starting at age 42. Mammogram. This may be done every 1-2 years. Talk to your health care provider about how often you should have regular mammograms. Talk with your health care provider about your test results, treatment options, and if necessary, the need for more tests. Vaccines  Your health care provider may recommend certain vaccines, such as: Influenza vaccine. This is  recommended every year. Tetanus, diphtheria, and acellular pertussis (Tdap, Td) vaccine. You may need a Td booster every 10 years. Zoster vaccine. You may need this after age 44. Pneumococcal 13-valent conjugate (PCV13) vaccine. One dose is recommended after age 56. Pneumococcal polysaccharide (PPSV23) vaccine. One dose is recommended after age 51. Talk to your health care provider about which screenings and vaccines you need and how often you need them. This information is not intended to replace advice given to you by your health care provider. Make sure you discuss any questions you have with your health care provider. Document Released: 03/02/2015 Document Revised: 10/24/2015 Document Reviewed: 12/05/2014 Elsevier Interactive Patient Education  2017 [employer] Prevention in the Home Falls can cause injuries. They can happen to people of all ages. There are many things you can do to make your home safe and to help prevent falls. What can I do on the outside of my home? Regularly fix the edges of walkways and driveways and fix any cracks. Remove anything that might make you trip as you walk through a door, such as a raised step or threshold. Trim any bushes or trees on the path to your home. Use bright outdoor lighting. Clear any walking paths of anything that might make someone trip, such as rocks or tools. Regularly check to see if handrails are loose or broken. Make sure that both sides of any steps have handrails. Any raised decks and porches should have guardrails on the edges. Have any leaves, snow, or ice cleared regularly. Use sand or salt on walking paths during winter. Clean up any spills in your garage right away. This includes oil or grease spills. What can I do in the bathroom? Use night lights. Install grab bars by the toilet and in the tub and shower. Do not use towel bars as grab bars. Use non-skid mats or decals in the tub or shower. If you need to sit down in  the shower, use a plastic, non-slip stool. Keep the floor dry. Clean up any water that spills on the floor as soon as it happens. Remove soap buildup in the tub or shower regularly. Attach bath mats securely with double-sided non-slip rug tape. Do not have throw rugs and other things on the floor that can make you trip. What can I do in the bedroom? Use night lights. Make sure that you have a light by your bed that is easy to reach. Do not use any sheets or blankets that are too big for your bed. They should not hang down onto the floor. Have a firm chair that has side arms. You can use this for support while you get dressed. Do not have throw rugs and other things on the floor that can make you trip. What can I do in the kitchen? Clean up any spills right away. Avoid walking on wet floors. Keep items that you use a lot in easy-to-reach places. If you need to reach something above you, use a strong step stool that has a grab bar. Keep electrical cords out of the way. Do not use floor polish or wax that  makes floors slippery. If you must use wax, use non-skid floor wax. Do not have throw rugs and other things on the floor that can make you trip. What can I do with my stairs? Do not leave any items on the stairs. Make sure that there are handrails on both sides of the stairs and use them. Fix handrails that are broken or loose. Make sure that handrails are as long as the stairways. Check any carpeting to make sure that it is firmly attached to the stairs. Fix any carpet that is loose or worn. Avoid having throw rugs at the top or bottom of the stairs. If you do have throw rugs, attach them to the floor with carpet tape. Make sure that you have a light switch at the top of the stairs and the bottom of the stairs. If you do not have them, ask someone to add them for you. What else can I do to help prevent falls? Wear shoes that: Do not have high heels. Have rubber bottoms. Are comfortable  and fit you well. Are closed at the toe. Do not wear sandals. If you use a stepladder: Make sure that it is fully opened. Do not climb a closed stepladder. Make sure that both sides of the stepladder are locked into place. Ask someone to hold it for you, if possible. Clearly mark and make sure that you can see: Any grab bars or handrails. First and last steps. Where the edge of each step is. Use tools that help you move around (mobility aids) if they are needed. These include: Canes. Walkers. Scooters. Crutches. Turn on the lights when you go into a dark area. Replace any light bulbs as soon as they burn out. Set up your furniture so you have a clear path. Avoid moving your furniture around. If any of your floors are uneven, fix them. If there are any pets around you, be aware of where they are. Review your medicines with your doctor. Some medicines can make you feel dizzy. This can increase your chance of falling. Ask your doctor what other things that you can do to help prevent falls. This information is not intended to replace advice given to you by your health care provider. Make sure you discuss any questions you have with your health care provider. Document Released: 11/30/2008 Document Revised: 07/12/2015 Document Reviewed: 03/10/2014 Elsevier Interactive Patient Education  2017 [employer].

## 2021-11-18 NOTE — Progress Notes (Signed)
Subjective:   Rebecca Howe is a 71 y.o. female who presents for Medicare Annual (Subsequent) preventive examination.   Virtual Visit via Telephone Note  I connected with  Donneta Romberg on 11/18/21 at 10:15 AM EDT by telephone and verified that I am speaking with the correct person using two identifiers.  Location: Patient: Home  Provider: [facility]  Persons participating in the virtual visit: patient/Nurse Health Advisor   I discussed the limitations, risks, security and privacy concerns of performing an evaluation and management service by telephone and the availability of in person appointments. The patient expressed understanding and agreed to proceed.  Interactive audio and video telecommunications were attempted between this nurse and patient, however failed, due to patient having technical difficulties OR patient did not have access to video capability.  We continued and completed visit with audio only.  Some vital signs may be absent or patient reported.   Daphane Shepherd, LPN  Review of Systems     Cardiac Risk Factors include: advanced age (>92mn, >>35women);dyslipidemia     Objective:    Today's Vitals   11/18/21 1035  Weight: 160 lb (72.6 kg)  Height: 5' 7" (1.702 m)   Body mass index is 25.06 kg/m.     11/18/2021   10:39 AM 03/28/2020    8:35 AM 03/28/2019    8:22 AM 03/23/2018    8:29 AM 11/18/2016    9:27 AM 11/13/2016    1:17 PM  Advanced Directives  Does Patient Have a Medical Advance Directive? Yes _0   [place] of [place]       Copy of [place] Chart? No - copy requested       Would patient like information on creating a medical advance directive?  Yes (MAU/Ambulatory/Procedural Areas - Information given) No - Patient declined No - Patient declined No - Patient declined     Current Medications (verified) Outpatient Encounter Medications as of 11/18/2021  Medication  Sig   acetaminophen (TYLENOL) 500 MG tablet Take 500 mg by mouth every 6 (six) hours as needed.   CALCIUM PO Take by mouth.   diazepam (VALIUM) 5 MG tablet Take 1 tablet 1 hour before dental procedure   ibuprofen (ADVIL,MOTRIN) 200 MG tablet Take 200 mg by mouth every 6 (six) hours as needed.   Lactobacillus (PROBIOTIC ACIDOPHILUS PO) Take by mouth.   Multiple Vitamin (MULTIVITAMIN) tablet Take 1 tablet by mouth daily.   Red Yeast Rice Extract (RED YEAST RICE PO) Take by mouth.   Vitamin D, Cholecalciferol, 25 MCG (1000 UT) CAPS Take 1 capsule by mouth daily. (Patient taking differently: Take 2 capsules by mouth daily.)   No facility-administered encounter medications on file as of 11/18/2021.    Allergies (verified) Patient has no known allergies.   History: Past Medical History:  Diagnosis Date   Abnormal Pap smear of cervix    COVID 08/2018   Hearing loss since childhood   bilateral hearing loss   Hyperlipidemia    Phreesia 03/25/2020   Irregular menses    PONV (postoperative nausea and vomiting)    SCC (squamous cell carcinoma), arm, right    Skin lesion of left leg 01/2007   atypical   Past Surgical History:  Procedure Laterality Date   BREAST LUMPECTOMY Left 1995   benign   COLONOSCOPY  07/04/2005   recheck in 10 years - Dr MCollene Mares  COLPOSCOPY  02/1996  benign   COLPOSCOPY  12/1998   benign   MASS EXCISION Right 11/18/2016   Procedure: EXCISIONAL BIOPSY MASS RIGHT LST WEB;  Surgeon: Daryll Brod, MD;  Location: [facility];  Service: Orthopedics;  Laterality: Right;  REG/FAB   TUBAL LIGATION Bilateral 1991   Family History  Problem Relation Age of Onset   Hypertension Father    Diabetes Paternal Grandmother    Hypertension Mother    Thyroid disease Mother    Hypertension Brother    Cancer Paternal Aunt 73       colon cancer   Breast cancer Paternal Aunt    Social History   Socioeconomic History   Marital status: Married    Spouse name: Not on  file   Number of children: Not on file   Years of education: Not on file   Highest education level: Not on file  Occupational History   Not on file  Tobacco Use   Smoking status: Never   Smokeless tobacco: Never  Vaping Use   Vaping Use: Never used  Substance and Sexual Activity   Alcohol use: No   Drug use: No   Sexual activity: Yes    Birth control/protection: Surgical    Comment: BTL  Other Topics Concern   Not on file  Social History Narrative   ** Merged History Encounter **       Social Determinants of Health   Financial Resource Strain: Low Risk  (11/18/2021)   Overall Financial Resource Strain (CARDIA)    Difficulty of Paying Living Expenses: Not hard at all  Food Insecurity: No Food Insecurity (11/18/2021)   Hunger Vital Sign    Worried About Running Out of Food in the Last Year: Never true    [place] in the Last Year: Never true  Transportation Needs: No Transportation Needs (11/18/2021)   PRAPARE - Hydrologist (Medical): No    Lack of Transportation (Non-Medical): No  Physical Activity: Insufficiently Active (11/18/2021)   Exercise Vital Sign    Days of Exercise per Week: 4 days    Minutes of Exercise per Session: 30 min  Stress: No Stress Concern Present (11/18/2021)   [place]    Feeling of Stress : Not at all  Social Connections: [place] (11/18/2021)   Social Connection and Isolation Panel [NHANES]    Frequency of Communication with Friends and Family: More than three times a week    Frequency of Social Gatherings with Friends and Family: More than three times a week    Attends Religious Services: More than 4 times per year    Active Member of [employer] or Organizations: Yes    Attends Music therapist: More than 4 times per year    Marital Status: Married    Tobacco Counseling Counseling given: Not Answered   Clinical  Intake:  Pre-visit preparation completed: Yes  Pain : No/denies pain     Nutritional Risks: None Diabetes: No  How often do you need to have someone help you when you read instructions, pamphlets, or other written materials from your doctor or pharmacy?: 1 - Never  Diabetic?no   Interpreter Needed?: No  Information entered by :: Jadene Pierini, LPN   Activities of Daily Living    11/18/2021   10:39 AM  In your present state of health, do you have any difficulty performing the following activities:  Hearing? 0  Vision? 0  Difficulty concentrating or making decisions? 0  Walking or climbing stairs? 0  Dressing or bathing? 0  Doing errands, shopping? 0  Preparing Food and eating ? N  Using the Toilet? N  In the past six months, have you accidently leaked urine? N  Do you have problems with loss of bowel control? N  Managing your Medications? N  Managing your Finances? N  Housekeeping or managing your Housekeeping? N    Patient Care Team: Ailene Ards, NP as PCP - General (Nurse Practitioner)  Indicate any recent Medical Services you may have received from other than [facility] providers in the past year (date may be approximate).     Assessment:   This is a routine wellness examination for [place].  Hearing/Vision screen Vision Screening - Comments:: Annual eye exams wear glasses   Dietary issues and exercise activities discussed: Current Exercise Habits: Home exercise routine, Type of exercise: walking, Time (Minutes): 30, Frequency (Times/Week): 4, Weekly Exercise (Minutes/Week): 120, Intensity: Mild, Exercise limited by: None identified   Goals Addressed             This Visit's Progress    DIET - INCREASE WATER INTAKE         Depression Screen    11/18/2021   10:38 AM 08/08/2021    8:10 AM 07/04/2021    9:56 AM 03/28/2020    8:32 AM 03/28/2019    8:20 AM 03/23/2018    8:24 AM 11/11/2017    3:18 PM  PHQ 2/9 Scores  PHQ - 2 Score 0 0 0 0 0 0 0  PHQ- 9 Score   0 1  0      Fall Risk    11/18/2021   10:37 AM 08/08/2021    8:10 AM 07/04/2021    9:56 AM 03/28/2020    8:32 AM 03/28/2019    8:20 AM  Fall Risk   Falls in the past year? 0 0 0 0 1  Number falls in past yr: 0 0 0  0  Injury with Fall? 0 0 0  0  Risk for fall due to : No Fall Risks   No Fall Risks No Fall Risks  Follow up Falls prevention discussed   Falls evaluation completed Falls evaluation completed    [place]:  Any stairs in or around the home? Yes  If so, are there any without handrails? No  Home free of loose throw rugs in walkways, pet beds, electrical cords, etc? Yes  Adequate lighting in your home to reduce risk of falls? Yes   ASSISTIVE DEVICES UTILIZED TO PREVENT FALLS:  Life alert? No  Use of a cane, walker or w/c? No  Grab bars in the bathroom? No  Shower chair or bench in shower? No  Elevated toilet seat or a handicapped toilet? No    Immunizations Immunization History  Administered Date(s) Administered   Fluad Quad(high Dose 65+) 11/18/2019, 11/21/2020, 11/12/2021   Influenza, High Dose Seasonal PF 11/27/2017   Influenza,inj,Quad PF,6+ Mos 11/25/2012, 11/17/2013, 12/05/2014, 12/06/2015, 11/11/2016, 12/06/2018   Pneumococcal Conjugate-13 01/20/2017   Pneumococcal Polysaccharide-23 03/23/2018   Tdap 07/19/2002, 07/29/2010, 07/23/2020   Zoster Recombinat (Shingrix) 03/06/2017, 05/18/2017   Zoster, Live 09/19/2011    TDAP status: Up to date  Flu Vaccine status: Up to date  Pneumococcal vaccine status: Up to date  Covid-19 vaccine status: Completed vaccines  Qualifies for Shingles Vaccine? Yes   Zostavax completed No   Shingrix Completed?: No.  Education has been provided regarding the importance of this vaccine. Patient has been advised to call insurance company to determine out of pocket expense if they have not yet received this vaccine. Advised may also receive vaccine at local pharmacy or Health Dept. Verbalized  acceptance and understanding.  Screening Tests Health Maintenance  Topic Date Due   DEXA SCAN  07/04/2022   MAMMOGRAM  10/30/2022   COLONOSCOPY (Pts 45-67yr Insurance coverage will need to be confirmed)  08/23/2025   TETANUS/TDAP  07/24/2030   Pneumonia Vaccine 70 Years old  Completed   INFLUENZA VACCINE  Completed   Hepatitis C Screening  Completed   Zoster Vaccines- Shingrix  Completed   HPV VACCINES  Aged Out   COVID-19 Vaccine  Discontinued    Health Maintenance  There are no preventive care reminders to display for this patient.  Colorectal cancer screening: Type of screening: Colonoscopy. Completed 08/23/2020. Repeat every 5 years  Mammogram status: Completed 10/29/2021. Repeat every year  Bone Density status: Completed 07/03/2020. Results reflect: Bone density results: OSTEOPOROSIS. Repeat every 2 years.  Lung Cancer Screening: (Low Dose CT Chest recommended if Age 70-80years, 30 pack-year currently smoking OR have quit w/in 15years.) does not qualify.   Lung Cancer Screening Referral: n/a  Additional Screening:  Hepatitis C Screening: does not qualify;   Vision Screening: Recommended annual ophthalmology exams for early detection of glaucoma and other disorders of the eye. Is the patient up to date with their annual eye exam?  Yes  Who is the provider or what is the name of the office in which the patient attends annual eye exams? Dr.Parker  If pt is not established with a provider, would they like to be referred to a provider to establish care? No .   Dental Screening: Recommended annual dental exams for proper oral hygiene  Community Resource Referral / Chronic Care Management: CRR required this visit?  No   CCM required this visit?  No      Plan:     I have personally reviewed and noted the following in the patient's chart:   Medical and social history Use of alcohol, tobacco or illicit drugs  Current medications and supplements including opioid  prescriptions. Patient is not currently taking opioid prescriptions. Functional ability and status Nutritional status Physical activity Advanced directives List of other physicians Hospitalizations, surgeries, and ER visits in previous 12 months Vitals Screenings to include cognitive, depression, and falls Referrals and appointments  In addition, I have reviewed and discussed with patient certain preventive protocols, quality metrics, and best practice recommendations. A written personalized care plan for preventive services as well as general preventive health recommendations were provided to patient.     LDaphane Shepherd LPN   [id number]  Nurse Notes: none

## 2022-01-06 DIAGNOSIS — H1132 Conjunctival hemorrhage, left eye: Secondary | ICD-10-CM | POA: Diagnosis not present

## 2022-01-06 DIAGNOSIS — H20042 Secondary noninfectious iridocyclitis, left eye: Secondary | ICD-10-CM | POA: Diagnosis not present

## 2022-01-06 DIAGNOSIS — H11422 Conjunctival edema, left eye: Secondary | ICD-10-CM | POA: Diagnosis not present

## 2022-01-16 ENCOUNTER — Other Ambulatory Visit: Payer: Self-pay | Admitting: Nurse Practitioner

## 2022-01-16 DIAGNOSIS — I77811 Abdominal aortic ectasia: Secondary | ICD-10-CM

## 2022-01-16 NOTE — Progress Notes (Signed)
Please call patient and notify her that she is due for abdominal ultrasound to monitor the widening of her abdominal aorta.  I have placed the imaging order, they should call her within the next few weeks to get this scheduled.  Please let me know if she has any questions.

## 2022-01-17 NOTE — Progress Notes (Signed)
Called pt and left detail voice mail for them to call back

## 2022-01-20 NOTE — Progress Notes (Signed)
Left voicemail for pt to call back.

## 2022-01-21 DIAGNOSIS — Z0100 Encounter for examination of eyes and vision without abnormal findings: Secondary | ICD-10-CM | POA: Diagnosis not present

## 2022-02-07 ENCOUNTER — Ambulatory Visit (INDEPENDENT_AMBULATORY_CARE_PROVIDER_SITE_OTHER): Payer: Medicare HMO | Admitting: Nurse Practitioner

## 2022-02-07 VITALS — BP 110/78 | HR 73 | Temp 98.5°F | Ht 67.0 in | Wt 165.4 lb

## 2022-02-07 DIAGNOSIS — E7800 Pure hypercholesterolemia, unspecified: Secondary | ICD-10-CM

## 2022-02-07 DIAGNOSIS — I77811 Abdominal aortic ectasia: Secondary | ICD-10-CM | POA: Diagnosis not present

## 2022-02-07 DIAGNOSIS — E559 Vitamin D deficiency, unspecified: Secondary | ICD-10-CM | POA: Diagnosis not present

## 2022-02-07 NOTE — Progress Notes (Signed)
   Established Patient Office Visit  Subjective   [name], female    DOB: [birth year]  Age: 71 y.o. MRN: [id number]  Chief Complaint  Patient presents with   Hyperlipidemia    Hyperlipidemia: Last lipid panel collected approximately 6 months ago which showed LDL of 145.  Patient continues taking red yeast rice.  ASCVD risk were approximately 7.4%.  Abdominal aortic ectasia: Noted on ultrasound in 2018 showed maximal diameter 2.6 cm.  Patient due to have this rescanned this month.  She has this scheduled for January 8.  Vitamin D deficiency: Last serum level was 40.63 with calcium of 9.7.  Patient continues on vitamin D3 supplementation and is tolerating well.    Review of Systems  Respiratory:  Negative for shortness of breath.   Cardiovascular:  Negative for chest pain.      Objective:     BP 110/78 (BP Location: Left Arm, Patient Position: Sitting, Cuff Size: Normal)   Pulse 73   Temp 98.5 F (36.9 C) (Oral)   Ht '5\' 7"'$  (1.702 m)   Wt 165 lb 6.4 oz (75 kg)   LMP 11/17/2001 (Approximate)   SpO2 98%   BMI 25.91 kg/m    Physical Exam Vitals reviewed.  Constitutional:      General: She is not in acute distress.    Appearance: Normal appearance.  HENT:     Head: Normocephalic and atraumatic.  Neck:     Vascular: No carotid bruit.  Cardiovascular:     Rate and Rhythm: Normal rate and regular rhythm.     Pulses: Normal pulses.     Heart sounds: Normal heart sounds.  Pulmonary:     Effort: Pulmonary effort is normal.     Breath sounds: Normal breath sounds.  Skin:    General: Skin is warm and dry.  Neurological:     General: No focal deficit present.     Mental Status: She is alert and oriented to person, place, and time.  Psychiatric:        Mood and Affect: Mood normal.        Behavior: Behavior normal.        Judgment: Judgment normal.      No results found for any visits on 02/07/22.    The 10-year ASCVD risk score (Arnett DK, et  al., 2019) is: 7.4%    Assessment & Plan:   Problem List Items Addressed This Visit       Cardiovascular and Mediastinum   Abdominal aortic ectasia ([place]) - Primary    Chronic, patient will have ultrasound completed as scheduled in a couple of weeks.  Further recommendations may be made based upon these results.        Other   Hyperlipidemia    Chronic, ASCVD risk were still less than 10%.  For now patient will continue on red yeast rice and we will plan to repeat fasting cholesterol at next office visit.      Vitamin D deficiency    Chronic, last serum check showed vitamin D at normal level.  Patient to continue taking supplementation at current dosages.       Return for F/u with Judson Roch in 6 months please come to appointment in a fastig state.    Ailene Ards, NP

## 2022-02-07 NOTE — Assessment & Plan Note (Signed)
Chronic, last serum check showed vitamin D at normal level.  Patient to continue taking supplementation at current dosages.

## 2022-02-07 NOTE — Assessment & Plan Note (Signed)
Chronic, ASCVD risk were still less than 10%.  For now patient will continue on red yeast rice and we will plan to repeat fasting cholesterol at next office visit.

## 2022-02-07 NOTE — Assessment & Plan Note (Signed)
Chronic, patient will have ultrasound completed as scheduled in a couple of weeks.  Further recommendations may be made based upon these results.

## 2022-02-24 ENCOUNTER — Ambulatory Visit
Admission: RE | Admit: 2022-02-24 | Discharge: 2022-02-24 | Disposition: A | Discharge status: Home / Self Care | Payer: Medicare HMO | Source: Ambulatory Visit | Attending: Nurse Practitioner | Admitting: Nurse Practitioner

## 2022-02-24 DIAGNOSIS — I77811 Abdominal aortic ectasia: Secondary | ICD-10-CM

## 2022-02-24 DIAGNOSIS — I77819 Aortic ectasia, unspecified site: Secondary | ICD-10-CM | POA: Diagnosis not present

## 2022-04-03 NOTE — Progress Notes (Deleted)
GYNECOLOGY  VISIT   HPI: 72 y.o.   Married  Caucasian  female   G2P2002 with Patient's last menstrual period was 11/17/2001 (approximate).   here for   cyst  GYNECOLOGIC HISTORY: Patient's last menstrual period was 11/17/2001 (approximate). Contraception:  PMP Menopausal hormone therapy:  n/a Last mammogram:  10/29/21 Breast Density Category B, BI-RADS CATEGORY 2 benign Last pap smear:   08/30/20 neg        OB History     Gravida  2   Para  2   Term  2   Preterm  0   AB  0   Living  2      SAB  0   IAB  0   Ectopic  0   Multiple  0   Live Births  2              Patient Active Problem List   Diagnosis Date Noted   Decreased calculated GFR 08/08/2021   Encounter for screening mammogram for malignant neoplasm of breast 08/08/2021   Encounter for general adult medical examination with abnormal findings 08/08/2021   Elevated blood pressure reading 07/04/2021   Abdominal aortic ectasia ([place]) 03/28/2020   Aortic atherosclerosis ([place]) 03/23/2018   Hearing impaired person, bilateral 03/23/2018   Osteopenia 09/04/2017   Fibroma 11/26/2016   Vitamin D deficiency 05/06/2013   Hyperlipidemia 02/04/2013    Past Medical History:  Diagnosis Date   Abnormal Pap smear of cervix    COVID 08/2018   Hearing loss since childhood   bilateral hearing loss   Hyperlipidemia    Phreesia 03/25/2020   Irregular menses    PONV (postoperative nausea and vomiting)    SCC (squamous cell carcinoma), arm, right    Skin lesion of left leg 01/2007   atypical    Past Surgical History:  Procedure Laterality Date   BREAST LUMPECTOMY Left 1995   benign   COLONOSCOPY  07/04/2005   recheck in 10 years - Dr Collene Mares   COLPOSCOPY  02/1996   benign   COLPOSCOPY  12/1998   benign   MASS EXCISION Right 11/18/2016   Procedure: EXCISIONAL BIOPSY MASS RIGHT LST WEB;  Surgeon: Daryll Brod, MD;  Location: [facility];  Service: Orthopedics;  Laterality: Right;  REG/FAB   TUBAL  LIGATION Bilateral 1991    Current Outpatient Medications  Medication Sig Dispense Refill   acetaminophen (TYLENOL) 500 MG tablet Take 500 mg by mouth every 6 (six) hours as needed.     Calcium Carbonate-Vitamin D 600-5 MG-MCG TABS SMARTSIG:1 By Mouth     CALCIUM PO Take by mouth.     diazepam (VALIUM) 5 MG tablet Take 1 tablet 1 hour before dental procedure 3 tablet 0   hydrocortisone 2.5 % cream Apply topically as directed.     ibuprofen (ADVIL,MOTRIN) 200 MG tablet Take 200 mg by mouth every 6 (six) hours as needed.     Lactobacillus (PROBIOTIC ACIDOPHILUS PO) Take by mouth.     Multiple Vitamin (MULTIVITAMIN) tablet Take 1 tablet by mouth daily.     Red Yeast Rice Extract (RED YEAST RICE PO) Take by mouth.     Vitamin D, Cholecalciferol, 25 MCG (1000 UT) CAPS Take 1 capsule by mouth daily. (Patient taking differently: Take 2 capsules by mouth daily.) 60 capsule    No current facility-administered medications for this visit.     ALLERGIES: Patient has no known allergies.  Family History  Problem Relation Age of Onset  Hypertension Father    Diabetes Paternal Grandmother    Hypertension Mother    Thyroid disease Mother    Hypertension Brother    Cancer Paternal Aunt 37       colon cancer   Breast cancer Paternal Aunt     Social History   Socioeconomic History   Marital status: Married    Spouse name: Not on file   Number of children: Not on file   Years of education: Not on file   Highest education level: Not on file  Occupational History   Not on file  Tobacco Use   Smoking status: Never   Smokeless tobacco: Never  Vaping Use   Vaping Use: Never used  Substance and Sexual Activity   Alcohol use: No   Drug use: No   Sexual activity: Yes    Birth control/protection: Surgical    Comment: BTL  Other Topics Concern   Not on file  Social History Narrative   ** Merged History Encounter **       Social Determinants of Health   Financial Resource Strain: Low  Risk  (11/18/2021)   Overall Financial Resource Strain (CARDIA)    Difficulty of Paying Living Expenses: Not hard at all  Food Insecurity: No Food Insecurity (11/18/2021)   Hunger Vital Sign    Worried About Running Out of Food in the Last Year: Never true    [place] in the Last Year: Never true  Transportation Needs: No Transportation Needs (11/18/2021)   PRAPARE - Hydrologist (Medical): No    Lack of Transportation (Non-Medical): No  Physical Activity: Insufficiently Active (11/18/2021)   Exercise Vital Sign    Days of Exercise per Week: 4 days    Minutes of Exercise per Session: 30 min  Stress: No Stress Concern Present (11/18/2021)   [place]    Feeling of Stress : Not at all  Social Connections: [place] (11/18/2021)   Social Connection and Isolation Panel [NHANES]    Frequency of Communication with Friends and Family: More than three times a week    Frequency of Social Gatherings with Friends and Family: More than three times a week    Attends Religious Services: More than 4 times per year    Active Member of [employer] or Organizations: Yes    Attends Music therapist: More than 4 times per year    Marital Status: Married  Human resources officer Violence: Not At Risk (11/18/2021)   Humiliation, Afraid, Rape, and Kick questionnaire    Fear of Current or Ex-Partner: No    Emotionally Abused: No    Physically Abused: No    Sexually Abused: No    Review of Systems  PHYSICAL EXAMINATION:    LMP 11/17/2001 (Approximate)     General appearance: alert, cooperative and appears stated age Head: Normocephalic, without obvious abnormality, atraumatic Neck: no adenopathy, supple, symmetrical, trachea midline and thyroid normal to inspection and palpation Lungs: clear to auscultation bilaterally Breasts: normal appearance, no masses or tenderness, No nipple retraction or  dimpling, No nipple discharge or bleeding, No axillary or supraclavicular adenopathy Heart: regular rate and rhythm Abdomen: soft, non-tender, no masses,  no organomegaly Extremities: extremities normal, atraumatic, no cyanosis or edema Skin: Skin color, texture, turgor normal. No rashes or lesions Lymph nodes: Cervical, supraclavicular, and axillary nodes normal. No abnormal inguinal nodes palpated Neurologic: Grossly normal  Pelvic: External genitalia:  no lesions  Urethra:  normal appearing urethra with no masses, tenderness or lesions              Bartholins and Skenes: normal                 Vagina: normal appearing vagina with normal color and discharge, no lesions              Cervix: no lesions                Bimanual Exam:  Uterus:  normal size, contour, position, consistency, mobility, non-tender              Adnexa: no mass, fullness, tenderness              Rectal exam: {yes no:314532}.  Confirms.              Anus:  normal sphincter tone, no lesions  Chaperone was present for exam:  ***  ASSESSMENT     PLAN     An After Visit Summary was printed and given to the patient.  ______ minutes face to face time of which over 50% was spent in counseling.

## 2022-04-09 ENCOUNTER — Ambulatory Visit: Payer: Medicare HMO | Admitting: Obstetrics and Gynecology

## 2022-04-21 NOTE — Progress Notes (Deleted)
GYNECOLOGY  VISIT   HPI: 72 y.o.   Married  Caucasian  female   G2P2002 with Patient's last menstrual period was 11/17/2001 (approximate).   here for   cyst  GYNECOLOGIC HISTORY: Patient's last menstrual period was 11/17/2001 (approximate). Contraception:  PMP Menopausal hormone therapy:  n/a Last mammogram:  10/29/21 Breast Density Category B, BI-RADS CAT 2 Benign Last pap smear:   08/30/20 neg, 04/01/17 neg: HR HPV neg        OB History     Gravida  2   Para  2   Term  2   Preterm  0   AB  0   Living  2      SAB  0   IAB  0   Ectopic  0   Multiple  0   Live Births  2              Patient Active Problem List   Diagnosis Date Noted   Decreased calculated GFR 08/08/2021   Encounter for screening mammogram for malignant neoplasm of breast 08/08/2021   Encounter for general adult medical examination with abnormal findings 08/08/2021   Elevated blood pressure reading 07/04/2021   Abdominal aortic ectasia ([place]) 03/28/2020   Aortic atherosclerosis ([place]) 03/23/2018   Hearing impaired person, bilateral 03/23/2018   Osteopenia 09/04/2017   Fibroma 11/26/2016   Vitamin D deficiency 05/06/2013   Hyperlipidemia 02/04/2013    Past Medical History:  Diagnosis Date   Abnormal Pap smear of cervix    COVID 08/2018   Hearing loss since childhood   bilateral hearing loss   Hyperlipidemia    Phreesia 03/25/2020   Irregular menses    PONV (postoperative nausea and vomiting)    SCC (squamous cell carcinoma), arm, right    Skin lesion of left leg 01/2007   atypical    Past Surgical History:  Procedure Laterality Date   BREAST LUMPECTOMY Left 1995   benign   COLONOSCOPY  07/04/2005   recheck in 10 years - Dr Collene Mares   COLPOSCOPY  02/1996   benign   COLPOSCOPY  12/1998   benign   MASS EXCISION Right 11/18/2016   Procedure: EXCISIONAL BIOPSY MASS RIGHT LST WEB;  Surgeon: Daryll Brod, MD;  Location: [facility];  Service: Orthopedics;  Laterality:  Right;  REG/FAB   TUBAL LIGATION Bilateral 1991    Current Outpatient Medications  Medication Sig Dispense Refill   acetaminophen (TYLENOL) 500 MG tablet Take 500 mg by mouth every 6 (six) hours as needed.     Calcium Carbonate-Vitamin D 600-5 MG-MCG TABS SMARTSIG:1 By Mouth     CALCIUM PO Take by mouth.     diazepam (VALIUM) 5 MG tablet Take 1 tablet 1 hour before dental procedure 3 tablet 0   hydrocortisone 2.5 % cream Apply topically as directed.     ibuprofen (ADVIL,MOTRIN) 200 MG tablet Take 200 mg by mouth every 6 (six) hours as needed.     Lactobacillus (PROBIOTIC ACIDOPHILUS PO) Take by mouth.     Multiple Vitamin (MULTIVITAMIN) tablet Take 1 tablet by mouth daily.     Red Yeast Rice Extract (RED YEAST RICE PO) Take by mouth.     Vitamin D, Cholecalciferol, 25 MCG (1000 UT) CAPS Take 1 capsule by mouth daily. (Patient taking differently: Take 2 capsules by mouth daily.) 60 capsule    No current facility-administered medications for this visit.     ALLERGIES: Patient has no known allergies.  Family History  Problem  Relation Age of Onset   Hypertension Father    Diabetes Paternal Grandmother    Hypertension Mother    Thyroid disease Mother    Hypertension Brother    Cancer Paternal Aunt 26       colon cancer   Breast cancer Paternal Aunt     Social History   Socioeconomic History   Marital status: Married    Spouse name: Not on file   Number of children: Not on file   Years of education: Not on file   Highest education level: Not on file  Occupational History   Not on file  Tobacco Use   Smoking status: Never   Smokeless tobacco: Never  Vaping Use   Vaping Use: Never used  Substance and Sexual Activity   Alcohol use: No   Drug use: No   Sexual activity: Yes    Birth control/protection: Surgical    Comment: BTL  Other Topics Concern   Not on file  Social History Narrative   ** Merged History Encounter **       Social Determinants of Health    Financial Resource Strain: Low Risk  (11/18/2021)   Overall Financial Resource Strain (CARDIA)    Difficulty of Paying Living Expenses: Not hard at all  Food Insecurity: No Food Insecurity (11/18/2021)   Hunger Vital Sign    Worried About Running Out of Food in the Last Year: Never true    [place] in the Last Year: Never true  Transportation Needs: No Transportation Needs (11/18/2021)   PRAPARE - Hydrologist (Medical): No    Lack of Transportation (Non-Medical): No  Physical Activity: Insufficiently Active (11/18/2021)   Exercise Vital Sign    Days of Exercise per Week: 4 days    Minutes of Exercise per Session: 30 min  Stress: No Stress Concern Present (11/18/2021)   [place]    Feeling of Stress : Not at all  Social Connections: [place] (11/18/2021)   Social Connection and Isolation Panel [NHANES]    Frequency of Communication with Friends and Family: More than three times a week    Frequency of Social Gatherings with Friends and Family: More than three times a week    Attends Religious Services: More than 4 times per year    Active Member of [employer] or Organizations: Yes    Attends Music therapist: More than 4 times per year    Marital Status: Married  Human resources officer Violence: Not At Risk (11/18/2021)   Humiliation, Afraid, Rape, and Kick questionnaire    Fear of Current or Ex-Partner: No    Emotionally Abused: No    Physically Abused: No    Sexually Abused: No    Review of Systems  PHYSICAL EXAMINATION:    LMP 11/17/2001 (Approximate)     General appearance: alert, cooperative and appears stated age Head: Normocephalic, without obvious abnormality, atraumatic Neck: no adenopathy, supple, symmetrical, trachea midline and thyroid normal to inspection and palpation Lungs: clear to auscultation bilaterally Breasts: normal appearance, no masses or  tenderness, No nipple retraction or dimpling, No nipple discharge or bleeding, No axillary or supraclavicular adenopathy Heart: regular rate and rhythm Abdomen: soft, non-tender, no masses,  no organomegaly Extremities: extremities normal, atraumatic, no cyanosis or edema Skin: Skin color, texture, turgor normal. No rashes or lesions Lymph nodes: Cervical, supraclavicular, and axillary nodes normal. No abnormal inguinal nodes palpated Neurologic: Grossly normal  Pelvic: External genitalia:  no lesions              Urethra:  normal appearing urethra with no masses, tenderness or lesions              Bartholins and Skenes: normal                 Vagina: normal appearing vagina with normal color and discharge, no lesions              Cervix: no lesions                Bimanual Exam:  Uterus:  normal size, contour, position, consistency, mobility, non-tender              Adnexa: no mass, fullness, tenderness              Rectal exam: {yes no:314532}.  Confirms.              Anus:  normal sphincter tone, no lesions  Chaperone was present for exam:  ***  ASSESSMENT     PLAN     An After Visit Summary was printed and given to the patient.  ______ minutes face to face time of which over 50% was spent in counseling.

## 2022-05-05 ENCOUNTER — Ambulatory Visit: Payer: Medicare HMO | Admitting: Obstetrics and Gynecology

## 2022-05-06 NOTE — Progress Notes (Signed)
72 y.o. G24P2002 Married Caucasian female noticed some cysts around the labia.  No pain,itching, or drainage.  They have ben present for some years.  No prior biopsy or removal.    PCP:   Jeralyn Ruths, NP  Patient's last menstrual period was 11/17/2001 (approximate).           Sexually active: Yes.    The current method of family planning is post menopausal status/BTL.    Exercising: Yes.     walking Smoker:  no  Health Maintenance: Pap:  08/30/20 neg, 04/01/17 neg: HR HPV neg History of abnormal Pap:  no MMG:  10/29/21 Breast Density Category B, BI-RADS CATEGORY 2 Benign Colonoscopy:  08/23/20 BMD:   07/03/20  Result  osteopenia in the right hip TDaP:  07/23/20 Gardasil:   no HIV: n/a Hep C: 01/08/15 neg    reports that she has never smoked. She has never used smokeless tobacco. She reports that she does not drink alcohol and does not use drugs.  Past Medical History:  Diagnosis Date   Abnormal Pap smear of cervix    COVID 08/2018   Hearing loss since childhood   bilateral hearing loss   Hyperlipidemia    Phreesia 03/25/2020   Irregular menses    PONV (postoperative nausea and vomiting)    SCC (squamous cell carcinoma), arm, right    Skin lesion of left leg 01/2007   atypical    Past Surgical History:  Procedure Laterality Date   BREAST LUMPECTOMY Left 1995   benign   COLONOSCOPY  07/04/2005   recheck in 10 years - Dr Collene Mares   COLPOSCOPY  02/1996   benign   COLPOSCOPY  12/1998   benign   MASS EXCISION Right 11/18/2016   Procedure: EXCISIONAL BIOPSY MASS RIGHT LST WEB;  Surgeon: Daryll Brod, MD;  Location: [facility];  Service: Orthopedics;  Laterality: Right;  REG/FAB   TUBAL LIGATION Bilateral 1991    Current Outpatient Medications  Medication Sig Dispense Refill   acetaminophen (TYLENOL) 500 MG tablet Take 500 mg by mouth every 6 (six) hours as needed.     Calcium Carbonate-Vitamin D 600-5 MG-MCG TABS SMARTSIG:1 By Mouth     CALCIUM PO Take by mouth.      hydrocortisone 2.5 % cream Apply topically as directed.     ibuprofen (ADVIL,MOTRIN) 200 MG tablet Take 200 mg by mouth every 6 (six) hours as needed.     Lactobacillus (PROBIOTIC ACIDOPHILUS PO) Take by mouth.     Multiple Vitamin (MULTIVITAMIN) tablet Take 1 tablet by mouth daily.     Red Yeast Rice Extract (RED YEAST RICE PO) Take by mouth.     Vitamin D, Cholecalciferol, 25 MCG (1000 UT) CAPS Take 1 capsule by mouth daily. (Patient taking differently: Take 2 capsules by mouth daily.) 60 capsule    diazepam (VALIUM) 5 MG tablet Take 1 tablet 1 hour before dental procedure (Patient not taking: Reported on 05/20/2022) 3 tablet 0   No current facility-administered medications for this visit.    Family History  Problem Relation Age of Onset   Hypertension Father    Diabetes Paternal Grandmother    Hypertension Mother    Thyroid disease Mother    Hypertension Brother    Cancer Paternal Aunt 47       colon cancer   Breast cancer Paternal Aunt     Review of Systems  All other systems reviewed and are negative.   Exam:   BP 126/86 (BP  Location: Right Arm, Patient Position: Sitting, Cuff Size: Normal)   Pulse 78   Ht 5' 7.25" (1.708 m)   Wt 163 lb (73.9 kg)   LMP 11/17/2001 (Approximate)   SpO2 98%   BMI 25.34 kg/m     General appearance: alert, cooperative and appears stated age Head: normocephalic, without obvious abnormality, atraumatic Neck: no adenopathy, supple, symmetrical, trachea midline and thyroid normal to inspection and palpation Lungs: clear to auscultation bilaterally Breasts: normal appearance, no masses or tenderness, No nipple retraction or dimpling, No nipple discharge or bleeding, No axillary adenopathy Heart: regular rate and rhythm Abdomen: soft, non-tender; no masses, no organomegaly Extremities: extremities normal, atraumatic, no cyanosis or edema Skin: skin color, texture, turgor normal. No rashes or lesions Lymph nodes: cervical, supraclavicular, and  axillary nodes normal. Neurologic: grossly normal  Pelvic: External genitalia:  2 sebaceous cysts of the left labia majora - 4 mm and 3 mm. No erythema.               No abnormal inguinal nodes palpated.              Urethra:  normal appearing urethra with no masses, tenderness or lesions              Bartholins and Skenes: normal                 Vagina: normal appearing vagina with normal color and discharge, no lesions              Cervix: no lesions              Bimanual Exam:  Uterus:  normal size, contour, position, consistency, mobility, non-tender              Adnexa: no mass, fullness, tenderness         Chaperone was present for exam:  Raquel Sarna  Assessment:   Sebaceous cysts of the left labia majora.  Plan: We discussed sebaceous cysts.  Reassurance given.  No treatment needed.  Patient will follow up for her breast and pelvic exam.   After visit summary provided.   18 min  total time was spent for this patient encounter, including preparation, face-to-face counseling with the patient, coordination of care, and documentation of the encounter.

## 2022-05-20 ENCOUNTER — Encounter: Payer: Self-pay | Admitting: Obstetrics and Gynecology

## 2022-05-20 ENCOUNTER — Ambulatory Visit (INDEPENDENT_AMBULATORY_CARE_PROVIDER_SITE_OTHER): Payer: Medicare HMO | Admitting: Obstetrics and Gynecology

## 2022-05-20 VITALS — BP 126/86 | HR 78 | Ht 67.25 in | Wt 163.0 lb

## 2022-05-20 DIAGNOSIS — L723 Sebaceous cyst: Secondary | ICD-10-CM | POA: Diagnosis not present

## 2022-05-20 DIAGNOSIS — Z800 Family history of malignant neoplasm of digestive organs: Secondary | ICD-10-CM | POA: Insufficient documentation

## 2022-05-20 NOTE — Patient Instructions (Signed)
Epidermoid Cyst  An epidermoid cyst, also known as epidermal cyst, is a sac made of skin tissue. The sac contains a substance called keratin. Keratin is a protein that is normally secreted through the hair follicles. When keratin becomes trapped in the top layer of skin (epidermis), it can form an epidermoid cyst. Epidermoid cysts can be found anywhere on your body. These cysts are usually harmless (benign), and they may not cause symptoms unless they become inflamed or infected. What are the causes? This condition may be caused by: A blocked hair follicle. A hair that curls and re-enters the skin instead of growing straight out of the skin (ingrown hair). A blocked pore. Irritated skin. An injury to the skin. Certain conditions that are passed along from parent to child (inherited). Human papillomavirus (HPV). This happens rarely when cysts occur on the bottom of the feet. Long-term (chronic) sun damage to the skin. What increases the risk? The following factors may make you more likely to develop an epidermoid cyst: Having acne. Being female. Having an injury to the skin. Being past puberty. Having certain rare genetic disorders. What are the signs or symptoms? The only symptom of this condition may be a small, painless lump underneath the skin. When an epidermal cyst ruptures, it may become inflamed. True infection in cysts is rare. Symptoms may include: Redness. Inflammation. Tenderness. Warmth. Keratin draining from the cyst. Keratin is grayish-white, bad-smelling substance. Pus draining from the cyst. How is this diagnosed? This condition is diagnosed with a physical exam. In some cases, you may have a sample of tissue (biopsy) taken from your cyst to be examined under a microscope or tested for bacteria. You may be referred to a health care provider who specializes in skin care (dermatologist). How is this treated? If a cyst becomes inflamed, treatment may include: Opening and  draining the cyst, done by a health care provider. After draining, minor surgery to remove the rest of the cyst may be done. Taking antibiotic medicine. Having injections of medicines (steroids) that help to reduce inflammation. Having surgery to remove the cyst. Surgery may be done if the cyst: Becomes large. Bothers you. Has a chance of turning into cancer. Do not try to open a cyst yourself. Follow these instructions at home: Medicines If you were prescribed an antibiotic medicine, take it it as told by your health care provider. Do not stop using the antibiotic even if you start to feel better. Take over-the-counter and prescription medicines only as told by your health care provider. General instructions Keep the area around your cyst clean and dry. Wear loose, dry clothing. Avoid touching your cyst. Check your cyst every day for signs of infection. Check for: Redness, swelling, or pain. Fluid or blood. Warmth. Pus or a bad smell. Keep all follow-up visits. This is important. How is this prevented? Wear clean, dry, clothing. Avoid wearing tight clothing. Keep your skin clean and dry. Take showers or baths every day. Contact a health care provider if: Your cyst develops symptoms of infection. Your condition is not improving or is getting worse. You develop a cyst that looks different from other cysts you have had. You have a fever. Get help right away if: Redness spreads from the cyst into the surrounding area. Summary An epidermoid cyst is a sac made of skin tissue. These cysts are usually harmless (benign), and they may not cause symptoms unless they become inflamed. If a cyst becomes inflamed, treatment may include surgery to open and drain the   cyst, or to remove it. Treatment may also include medicines by mouth or through an injection. Take over-the-counter and prescription medicines only as told by your health care provider. If you were prescribed an antibiotic medicine,  take it as told by your health care provider. Do not stop using the antibiotic even if you start to feel better. Contact a health care provider if your condition is not improving or is getting worse. Keep all follow-up visits as told by your health care provider. This is important. This information is not intended to replace advice given to you by your health care provider. Make sure you discuss any questions you have with your health care provider. Document Revised: 05/11/2019 Document Reviewed: 05/11/2019 Elsevier Patient Education  2023 Elsevier Inc.  

## 2022-05-27 ENCOUNTER — Ambulatory Visit (INDEPENDENT_AMBULATORY_CARE_PROVIDER_SITE_OTHER): Payer: Medicare HMO | Admitting: Internal Medicine

## 2022-05-27 ENCOUNTER — Encounter: Payer: Self-pay | Admitting: Internal Medicine

## 2022-05-27 VITALS — BP 120/80 | HR 82 | Temp 98.2°F | Ht 67.25 in | Wt 164.5 lb

## 2022-05-27 DIAGNOSIS — H1012 Acute atopic conjunctivitis, left eye: Secondary | ICD-10-CM

## 2022-05-27 MED ORDER — OLOPATADINE HCL 0.2 % OP SOLN: 1.0000 [drp] | Freq: Two times a day (BID) | OPHTHALMIC | 0 refills | Status: DC | PRN

## 2022-05-27 NOTE — Patient Instructions (Signed)
We have sent in the eye drops to use 1 drop up to twice a day as needed for the eyes.

## 2022-05-27 NOTE — Assessment & Plan Note (Signed)
Rx patanol 0.2 % to use prn. Advised to use warm compress. Likely related to recent cold and rubbing eyes a lot.

## 2022-05-27 NOTE — Progress Notes (Signed)
   Subjective:   [name], female    DOB: [birth year], 72 y.o.   MRN: [id number]  HPI The patient is a 72 YO female coming in for left eye redness and itchiness. Recent cold. Cold is improving.  Review of Systems  Constitutional: Negative.   HENT: Negative.    Eyes:  Positive for redness.  Respiratory:  Negative for cough, chest tightness and shortness of breath.   Cardiovascular:  Negative for chest pain, palpitations and leg swelling.  Gastrointestinal:  Negative for abdominal distention, abdominal pain, constipation, diarrhea, nausea and vomiting.  Musculoskeletal: Negative.   Skin: Negative.   Neurological: Negative.   Psychiatric/Behavioral: Negative.      Objective:  Physical Exam Constitutional:      Appearance: She is well-developed.  HENT:     Head: Normocephalic and atraumatic.  Eyes:     Comments: Mild redness left eye with mild swelling lower eyelid without rash  Cardiovascular:     Rate and Rhythm: Normal rate and regular rhythm.  Pulmonary:     Effort: Pulmonary effort is normal. No respiratory distress.     Breath sounds: Normal breath sounds. No wheezing or rales.  Abdominal:     General: Bowel sounds are normal. There is no distension.     Palpations: Abdomen is soft.     Tenderness: There is no abdominal tenderness. There is no rebound.  Musculoskeletal:     Cervical back: Normal range of motion.  Skin:    General: Skin is warm and dry.  Neurological:     Mental Status: She is alert and oriented to person, place, and time.     Coordination: Coordination normal.     Vitals:   05/27/22 1441  BP: 120/80  Pulse: 82  Temp: 98.2 F (36.8 C)  TempSrc: Oral  SpO2: 98%  Weight: 164 lb 8 oz (74.6 kg)  Height: 5' 7.25" (1.708 m)    Assessment & Plan:

## 2022-08-14 ENCOUNTER — Ambulatory Visit (INDEPENDENT_AMBULATORY_CARE_PROVIDER_SITE_OTHER): Payer: Medicare HMO | Admitting: Nurse Practitioner

## 2022-08-14 ENCOUNTER — Other Ambulatory Visit: Payer: Self-pay | Admitting: Nurse Practitioner

## 2022-08-14 VITALS — BP 118/72 | HR 71 | Temp 98.0°F | Ht 67.25 in | Wt 164.0 lb

## 2022-08-14 DIAGNOSIS — I700 Atherosclerosis of aorta: Secondary | ICD-10-CM | POA: Diagnosis not present

## 2022-08-14 DIAGNOSIS — M8589 Other specified disorders of bone density and structure, multiple sites: Secondary | ICD-10-CM

## 2022-08-14 DIAGNOSIS — E785 Hyperlipidemia, unspecified: Secondary | ICD-10-CM | POA: Diagnosis not present

## 2022-08-14 DIAGNOSIS — I77811 Abdominal aortic ectasia: Secondary | ICD-10-CM | POA: Diagnosis not present

## 2022-08-14 LAB — LIPID PANEL
Cholesterol: 210 mg/dL — ABNORMAL HIGH (ref 0–200)
HDL: 49.8 mg/dL (ref >39.00)
LDL Cholesterol: 138 mg/dL — ABNORMAL HIGH (ref 0–99)
NonHDL: 160.03
Total CHOL/HDL Ratio: 4
Triglycerides: 110 mg/dL (ref 0.0–149.0)
VLDL: 22 mg/dL (ref 0.0–40.0)

## 2022-08-14 LAB — COMPREHENSIVE METABOLIC PANEL
ALT: 15 U/L (ref 0–35)
AST: 24 U/L (ref 0–37)
Albumin: 4.3 g/dL (ref 3.5–5.2)
Alkaline Phosphatase: 80 U/L (ref 39–117)
BUN: 14 mg/dL (ref 6–23)
CO2: 32 mEq/L (ref 19–32)
Calcium: 10 mg/dL (ref 8.4–10.5)
Chloride: 103 mEq/L (ref 96–112)
Creatinine, Ser: 0.91 mg/dL (ref 0.40–1.20)
GFR: 63.74 mL/min (ref >60.00)
Glucose, Bld: 95 mg/dL (ref 70–99)
Potassium: 4.7 mEq/L (ref 3.5–5.1)
Sodium: 141 mEq/L (ref 135–145)
Total Bilirubin: 0.5 mg/dL (ref 0.2–1.2)
Total Protein: 7.4 g/dL (ref 6.0–8.3)

## 2022-08-14 NOTE — Progress Notes (Signed)
Established Patient Office Visit  Subjective   [name], female    DOB: Jun 25, 1950  Age: 72 y.o. MRN: [id number]  Chief Complaint  Patient presents with   Hyperlipidemia    Hyperlipidemia: Patient also has known aortic atherosclerosis.  She continues to be on red yeast rice supplement, due to have fasting lipid panel checked today.  Abdominal aortic ectasia: Last ultrasound was negative for aneurysm, and per radiology recommendation does not require additional follow-up. Osteopenia: Last DEXA completed in May 2022.  Patient is on calcium and vitamin D supplementation.    Review of Systems  Eyes:  Negative for blurred vision.  Respiratory:  Negative for shortness of breath.   Cardiovascular:  Negative for chest pain.  Neurological:  Negative for headaches.      Objective:     BP 118/72   Pulse 71   Temp 98 F (36.7 C) (Temporal)   Ht 5' 7.25" (1.708 m)   Wt 164 lb (74.4 kg)   LMP 11/17/2001 (Approximate)   SpO2 96%   BMI 25.50 kg/m  BP Readings from Last 3 Encounters:  08/14/22 118/72  05/27/22 120/80  05/20/22 126/86   Wt Readings from Last 3 Encounters:  08/14/22 164 lb (74.4 kg)  05/27/22 164 lb 8 oz (74.6 kg)  05/20/22 163 lb (73.9 kg)      Physical Exam Vitals reviewed.  Constitutional:      General: She is not in acute distress.    Appearance: Normal appearance.  HENT:     Head: Normocephalic and atraumatic.  Neck:     Vascular: No carotid bruit.  Cardiovascular:     Rate and Rhythm: Normal rate and regular rhythm.     Pulses: Normal pulses.     Heart sounds: Normal heart sounds.  Pulmonary:     Effort: Pulmonary effort is normal.     Breath sounds: Normal breath sounds.  Skin:    General: Skin is warm and dry.  Neurological:     General: No focal deficit present.     Mental Status: She is alert and oriented to person, place, and time.  Psychiatric:        Mood and Affect: Mood normal.        Behavior: Behavior normal.         Judgment: Judgment normal.      No results found for any visits on 08/14/22.    The 10-year ASCVD risk score (Arnett DK, et al., 2019) is: 8.4%    Assessment & Plan:   Problem List Items Addressed This Visit       Cardiovascular and Mediastinum   Aortic atherosclerosis (HCC)    Chronic Will check fasting lipid panel today. Patient to continue on red yeast rice supplement Further recommendations may be made based upon the lipid results.      Relevant Orders   Lipid panel   Comprehensive metabolic panel   Abdominal aortic ectasia (HCC)    Chronic Discussed ultrasound results and that follow-up is not recommended.  Patient reports understanding and agrees to no additional imaging at this time.        Musculoskeletal and Integument   Osteopenia    Chronic Discussed repeating DEXA scan, per patient preference she would like to defer this for now.  She reports that even if osteoporosis was present she would prefer not to start medication to treat this. We did discuss the importance of continuing her calcium and vitamin D supplementation as well  as performing regular weightbearing activities, and activities to maintain leg strength to prevent falls She was encouraged to let me know if she would like DEXA scan in the near future, she reports understanding.        Other   Hyperlipidemia - Primary    Chronic Will check fasting lipid panel today. Patient to continue on red yeast rice supplement Further recommendations may be made based upon the lipid results.      Relevant Orders   Lipid panel   Comprehensive metabolic panel    Return in about 1 year (around 08/14/2023) for F/U with Sinclair Arrazola.    Elenore Paddy, NP

## 2022-08-14 NOTE — Assessment & Plan Note (Signed)
Chronic Discussed repeating DEXA scan, per patient preference she would like to defer this for now.  She reports that even if osteoporosis was present she would prefer not to start medication to treat this. We did discuss the importance of continuing her calcium and vitamin D supplementation as well as performing regular weightbearing activities, and activities to maintain leg strength to prevent falls She was encouraged to let me know if she would like DEXA scan in the near future, she reports understanding.

## 2022-08-14 NOTE — Assessment & Plan Note (Signed)
Chronic Will check fasting lipid panel today. Patient to continue on red yeast rice supplement Further recommendations may be made based upon the lipid results.

## 2022-08-14 NOTE — Assessment & Plan Note (Signed)
Chronic Will check fasting lipid panel today. Patient to continue on red yeast rice supplement Further recommendations may be made based upon the lipid results. 

## 2022-08-14 NOTE — Assessment & Plan Note (Signed)
Chronic Discussed ultrasound results and that follow-up is not recommended.  Patient reports understanding and agrees to no additional imaging at this time.

## 2022-10-16 DIAGNOSIS — L821 Other seborrheic keratosis: Secondary | ICD-10-CM | POA: Diagnosis not present

## 2022-10-16 DIAGNOSIS — L570 Actinic keratosis: Secondary | ICD-10-CM | POA: Diagnosis not present

## 2022-10-16 DIAGNOSIS — L820 Inflamed seborrheic keratosis: Secondary | ICD-10-CM | POA: Diagnosis not present

## 2022-10-16 DIAGNOSIS — L720 Epidermal cyst: Secondary | ICD-10-CM | POA: Diagnosis not present

## 2022-10-16 DIAGNOSIS — D225 Melanocytic nevi of trunk: Secondary | ICD-10-CM | POA: Diagnosis not present

## 2022-10-16 DIAGNOSIS — D2372 Other benign neoplasm of skin of left lower limb, including hip: Secondary | ICD-10-CM | POA: Diagnosis not present

## 2022-10-16 DIAGNOSIS — D1801 Hemangioma of skin and subcutaneous tissue: Secondary | ICD-10-CM | POA: Diagnosis not present

## 2022-10-16 DIAGNOSIS — L814 Other melanin hyperpigmentation: Secondary | ICD-10-CM | POA: Diagnosis not present

## 2022-10-16 DIAGNOSIS — L738 Other specified follicular disorders: Secondary | ICD-10-CM | POA: Diagnosis not present

## 2022-10-16 DIAGNOSIS — Z85828 Personal history of other malignant neoplasm of skin: Secondary | ICD-10-CM | POA: Diagnosis not present

## 2022-10-22 ENCOUNTER — Ambulatory Visit (INDEPENDENT_AMBULATORY_CARE_PROVIDER_SITE_OTHER): Payer: Medicare HMO | Admitting: Internal Medicine

## 2022-10-22 VITALS — BP 132/74 | HR 89 | Temp 98.1°F

## 2022-10-22 DIAGNOSIS — G4483 Primary cough headache: Secondary | ICD-10-CM

## 2022-10-22 DIAGNOSIS — J029 Acute pharyngitis, unspecified: Secondary | ICD-10-CM | POA: Diagnosis not present

## 2022-10-22 DIAGNOSIS — R5383 Other fatigue: Secondary | ICD-10-CM | POA: Diagnosis not present

## 2022-10-22 DIAGNOSIS — R509 Fever, unspecified: Secondary | ICD-10-CM

## 2022-10-22 DIAGNOSIS — J069 Acute upper respiratory infection, unspecified: Secondary | ICD-10-CM

## 2022-10-22 LAB — POCT INFLUENZA A/B
Influenza A, POC: NEGATIVE
Influenza B, POC: NEGATIVE

## 2022-10-22 LAB — POC COVID19 BINAXNOW: SARS Coronavirus 2 Ag: NEGATIVE

## 2022-10-22 LAB — POCT RAPID STREP A (OFFICE): Rapid Strep A Screen: NEGATIVE

## 2022-10-22 NOTE — Progress Notes (Signed)
Established Patient Office Visit     CC/Reason for Visit: URI symptoms  HPI: Rebecca Howe is a 72 y.o. female who is coming in today for the above mentioned reasons.  For the past 4 to 5 days has been experiencing low-grade temps with a Tmax of 99.5, headache, fatigue, chills cough.  Past Medical/Surgical History: Past Medical History:  Diagnosis Date   Abnormal Pap smear of cervix    COVID 08/2018   Hearing loss since childhood   bilateral hearing loss   Hyperlipidemia    Phreesia 03/25/2020   Irregular menses    PONV (postoperative nausea and vomiting)    SCC (squamous cell carcinoma), arm, right    Skin lesion of left leg 01/2007   atypical    Past Surgical History:  Procedure Laterality Date   BREAST LUMPECTOMY Left 1995   benign   COLONOSCOPY  07/04/2005   recheck in 10 years - Dr Loreta Ave   COLPOSCOPY  02/1996   benign   COLPOSCOPY  12/1998   benign   MASS EXCISION Right 11/18/2016   Procedure: EXCISIONAL BIOPSY MASS RIGHT LST WEB;  Surgeon: Cindee Salt, MD;  Location: [facility];  Service: Orthopedics;  Laterality: Right;  REG/FAB   TUBAL LIGATION Bilateral 1991    Social History:  reports that she has never smoked. She has never used smokeless tobacco. She reports that she does not drink alcohol and does not use drugs.  Allergies: No Known Allergies  Family History:  Family History  Problem Relation Age of Onset   Hypertension Father    Diabetes Paternal Grandmother    Hypertension Mother    Thyroid disease Mother    Hypertension Brother    Cancer Paternal Aunt 39       colon cancer   Breast cancer Paternal Aunt      Current Outpatient Medications:    acetaminophen (TYLENOL) 500 MG tablet, Take 500 mg by mouth every 6 (six) hours as needed., Disp: , Rfl:    Calcium Carbonate-Vitamin D 600-5 MG-MCG TABS, SMARTSIG:1 By Mouth, Disp: , Rfl:    CALCIUM PO, Take by mouth., Disp: , Rfl:    hydrocortisone 2.5 % cream, Apply topically  as directed., Disp: , Rfl:    ibuprofen (ADVIL,MOTRIN) 200 MG tablet, Take 200 mg by mouth every 6 (six) hours as needed., Disp: , Rfl:    Lactobacillus (PROBIOTIC ACIDOPHILUS PO), Take by mouth., Disp: , Rfl:    Multiple Vitamin (MULTIVITAMIN) tablet, Take 1 tablet by mouth daily., Disp: , Rfl:    Red Yeast Rice Extract (RED YEAST RICE PO), Take by mouth., Disp: , Rfl:    Vitamin D, Cholecalciferol, 25 MCG (1000 UT) CAPS, Take 1 capsule by mouth daily. (Patient taking differently: Take 2 capsules by mouth daily.), Disp: 60 capsule, Rfl:    diazepam (VALIUM) 5 MG tablet, Take 1 tablet 1 hour before dental procedure (Patient not taking: Reported on 10/22/2022), Disp: 3 tablet, Rfl: 0  Review of Systems:  Negative unless indicated in HPI.   Physical Exam: Vitals:   10/22/22 1029  BP: 132/74  Pulse: 89  Temp: 98.1 F (36.7 C)  TempSrc: Oral  SpO2: 94%    There is no height or weight on file to calculate BMI.   Physical Exam Vitals reviewed.  Constitutional:      Appearance: Normal appearance.  HENT:     Right Ear: Ear canal and external ear normal. A middle ear effusion is present.  Left Ear: Ear canal and external ear normal. A middle ear effusion is present.     Mouth/Throat:     Mouth: Mucous membranes are moist.     Pharynx: Oropharynx is clear. Posterior oropharyngeal erythema present.  Eyes:     Conjunctiva/sclera: Conjunctivae normal.     Pupils: Pupils are equal, round, and reactive to light.  Cardiovascular:     Rate and Rhythm: Normal rate and regular rhythm.  Pulmonary:     Effort: Pulmonary effort is normal.     Breath sounds: Normal breath sounds.  Neurological:     Mental Status: She is alert.      Impression and Plan:  Viral upper respiratory tract infection  Headache after cough -     POCT Influenza A/B -     POC COVID-19 BinaxNow  Fever, unspecified fever cause -     POCT Influenza A/B -     POC COVID-19 BinaxNow  Other fatigue -     POCT  Influenza A/B -     POC COVID-19 BinaxNow  Sore throat -     POCT rapid strep A -     POCT Influenza A/B -     POC COVID-19 BinaxNow   -In office flu, COVID, strep tests are all negative. -Given exam findings, PNA, pharyngitis, ear infection are not likely, hence abx have not been prescribed. -Have advised rest, fluids, OTC antihistamines, cough suppressants and mucinex. -RTC if no improvement in 10-14 days.   Time spent:22 minutes reviewing chart, interviewing and examining patient and formulating plan of care.     Chaya Jan, MD [facility]

## 2022-10-24 DIAGNOSIS — H2513 Age-related nuclear cataract, bilateral: Secondary | ICD-10-CM | POA: Diagnosis not present

## 2022-10-24 DIAGNOSIS — H5203 Hypermetropia, bilateral: Secondary | ICD-10-CM | POA: Diagnosis not present

## 2022-10-30 ENCOUNTER — Telehealth: Payer: Self-pay

## 2022-10-30 DIAGNOSIS — R051 Acute cough: Secondary | ICD-10-CM

## 2022-10-30 MED ORDER — PROMETHAZINE-DM 6.25-15 MG/5ML PO SYRP
5.0000 mL | ORAL_SOLUTION | Freq: Three times a day (TID) | ORAL | 0 refills | Status: AC | PRN
Start: 2022-10-30 — End: ?

## 2022-10-30 NOTE — Telephone Encounter (Signed)
I will prescribe her promethazine-dextromethorphan cough syrup. She can take 5ml every 8 hours as needed for cough. She should not mix this with any other over the counter antihistamines or robitussin. Plain mucinex is fine, but not mucinex-DM.   This medication can make her sleepy/dizzy. I recommend that she not drive or operate heavy machinery while taking the cough syrup. Please notify her of this.   If symptoms persist another 5-7 days she should come back to office for evaluation.

## 2022-10-30 NOTE — Addendum Note (Signed)
Addended by: Elenore Paddy on: 10/30/2022 09:45 AM   Modules accepted: Orders

## 2022-10-30 NOTE — Telephone Encounter (Signed)
Pt has called and stated she was seen on 10/22/2022 and has consistent productive cough that is not allowing her to sleep. Pt is asking of her PCP can please send in a cough medication to her pharmacy to help with her cough.

## 2022-10-30 NOTE — Telephone Encounter (Signed)
Made pt aware of provider note, pt showed understanding.

## 2022-11-04 DIAGNOSIS — Z1231 Encounter for screening mammogram for malignant neoplasm of breast: Secondary | ICD-10-CM | POA: Diagnosis not present

## 2022-11-04 LAB — HM MAMMOGRAPHY

## 2022-11-05 ENCOUNTER — Encounter: Payer: Self-pay | Admitting: Nurse Practitioner

## 2022-11-12 NOTE — Progress Notes (Unsigned)
72 y.o. G2P2002 Married Caucasian female here for annual exam.    No GYN concerns.  Taking amoxicillin for sinusitis.   PCP:   Jiles Prows, NP  Patient's last menstrual period was 11/17/2001 (approximate).           Sexually active: Yes.    The current method of family planning is post menopausal status.    Exercising: Yes.     walking Smoker:  no  Health Maintenance: Pap:  08/30/20 neg, 04/01/17 neg: HR HPV neg History of abnormal Pap:  yes.  Hx LGSIL 2000. MMG:  11/04/22 Breast Density Cat B, BI-RADS CAT 1 neg Colonoscopy:  08/23/20 BMD:   07/03/20  Result  osteopenia of hip. FRAX 15%/1.6% risk of fracture. TDaP:  07/23/20 Gardasil:   no HIV: n/a Hep C: 01/08/15 neg Screening Labs:  PCP Flu vaccine with PCP.  Covid vaccine discussed.   reports that she has never smoked. She has never used smokeless tobacco. She reports that she does not drink alcohol and does not use drugs.  Past Medical History:  Diagnosis Date   Abnormal Pap smear of cervix    COVID 08/2018   Hearing loss since childhood   bilateral hearing loss   Hyperlipidemia    Phreesia 03/25/2020   Irregular menses    PONV (postoperative nausea and vomiting)    SCC (squamous cell carcinoma), arm, right    Skin lesion of left leg 01/2007   atypical    Past Surgical History:  Procedure Laterality Date   BREAST LUMPECTOMY Left 1995   benign   COLONOSCOPY  07/04/2005   recheck in 10 years - Dr Loreta Ave   COLPOSCOPY  02/1996   benign   COLPOSCOPY  12/1998   benign   MASS EXCISION Right 11/18/2016   Procedure: EXCISIONAL BIOPSY MASS RIGHT LST WEB;  Surgeon: Cindee Salt, MD;  Location: [facility];  Service: Orthopedics;  Laterality: Right;  REG/FAB   TUBAL LIGATION Bilateral 1991    Current Outpatient Medications  Medication Sig Dispense Refill   acetaminophen (TYLENOL) 500 MG tablet Take 500 mg by mouth every 6 (six) hours as needed.     amoxicillin (AMOXIL) 875 MG tablet Take 1 tablet (875 mg total)  by mouth 2 (two) times daily for 10 days. 20 tablet 0   Calcium Carbonate-Vitamin D 600-5 MG-MCG TABS SMARTSIG:1 By Mouth     CALCIUM PO Take by mouth.     diazepam (VALIUM) 5 MG tablet Take 1 tablet 1 hour before dental procedure 3 tablet 0   hydrocortisone 2.5 % cream Apply topically as directed.     ibuprofen (ADVIL,MOTRIN) 200 MG tablet Take 200 mg by mouth every 6 (six) hours as needed.     Lactobacillus (PROBIOTIC ACIDOPHILUS PO) Take by mouth.     Multiple Vitamin (MULTIVITAMIN) tablet Take 1 tablet by mouth daily.     Red Yeast Rice Extract (RED YEAST RICE PO) Take by mouth.     Vitamin D, Cholecalciferol, 25 MCG (1000 UT) CAPS Take 1 capsule by mouth daily. (Patient taking differently: Take 2 capsules by mouth daily.) 60 capsule    No current facility-administered medications for this visit.    Family History  Problem Relation Age of Onset   Hypertension Father    Diabetes Paternal Grandmother    Hypertension Mother    Thyroid disease Mother    Hypertension Brother    Cancer Paternal Aunt 44       colon cancer   Breast  cancer Paternal Aunt     Review of Systems  All other systems reviewed and are negative.   Exam:   BP 124/68 (BP Location: Left Arm, Patient Position: Sitting, Cuff Size: Normal)   Pulse 71   Ht 5\' 7"  (1.702 m)   Wt 163 lb (73.9 kg)   LMP 11/17/2001 (Approximate)   SpO2 98%   BMI 25.53 kg/m     General appearance: alert, cooperative and appears stated age Head: normocephalic, without obvious abnormality, atraumatic Neck: no adenopathy, supple, symmetrical, trachea midline and thyroid normal to inspection and palpation Lungs: clear to auscultation bilaterally Breasts: normal appearance, no masses or tenderness, No nipple retraction or dimpling, No nipple discharge or bleeding, No axillary adenopathy Heart: regular rate and rhythm Abdomen: soft, non-tender; no masses, no organomegaly Extremities: extremities normal, atraumatic, no cyanosis or  edema Skin: skin color, texture, turgor normal. No rashes or lesions Lymph nodes: cervical, supraclavicular, and axillary nodes normal. Neurologic: grossly normal  Pelvic: External genitalia:  no lesions              No abnormal inguinal nodes palpated.              Urethra:  normal appearing urethra with no masses, tenderness or lesions              Bartholins and Skenes: normal                 Vagina: normal appearing vagina with normal color and discharge, no lesions              Cervix: no lesions              Pap taken: yes Bimanual Exam:  Uterus:  normal size, contour, position, consistency, mobility, non-tender              Adnexa: no mass, fullness, tenderness              Rectal exam: ye.  Confirms.              Anus:  normal sphincter tone, no lesions  Chaperone was present for exam:  Warren Lacy, CMA  Assessment:   Encounter for breast and pelvic exam.  Cervical cancer screening.  Hx LGSIL. Osteopenia.  Followed by PCP.   Plan: Mammogram screening discussed. Self breast awareness reviewed. Pap and HR HPV collected. Guidelines for Calcium, Vitamin D, regular exercise program including cardiovascular and weight bearing exercise. BMD reviewed.   BMD at [facility] for after Feb 18, 2023 per patient request.  Follow up in 2 years for breast and pelvic exam and prn.  15 min  total time was spent for this patient encounter, including preparation, face-to-face counseling with the patient, coordination of care, and documentation of the encounter.

## 2022-11-20 ENCOUNTER — Ambulatory Visit: Payer: Medicare HMO | Admitting: Family Medicine

## 2022-11-20 ENCOUNTER — Encounter: Payer: Self-pay | Admitting: Family Medicine

## 2022-11-20 VITALS — BP 110/76 | HR 75 | Temp 97.8°F | Ht 67.25 in | Wt 163.0 lb

## 2022-11-20 DIAGNOSIS — G44209 Tension-type headache, unspecified, not intractable: Secondary | ICD-10-CM

## 2022-11-20 DIAGNOSIS — J0110 Acute frontal sinusitis, unspecified: Secondary | ICD-10-CM

## 2022-11-20 LAB — CBC WITH DIFFERENTIAL/PLATELET
Basophils Absolute: 0.1 10*3/uL (ref 0.0–0.1)
Basophils Relative: 1 % (ref 0.0–3.0)
Eosinophils Absolute: 0.1 10*3/uL (ref 0.0–0.7)
Eosinophils Relative: 2.1 % (ref 0.0–5.0)
HCT: 38 % (ref 36.0–46.0)
Hemoglobin: 12.4 g/dL (ref 12.0–15.0)
Lymphocytes Relative: 31.3 % (ref 12.0–46.0)
Lymphs Abs: 1.9 10*3/uL (ref 0.7–4.0)
MCHC: 32.6 g/dL (ref 30.0–36.0)
MCV: 89 fL (ref 78.0–100.0)
Monocytes Absolute: 0.4 10*3/uL (ref 0.1–1.0)
Monocytes Relative: 6.5 % (ref 3.0–12.0)
Neutro Abs: 3.6 10*3/uL (ref 1.4–7.7)
Neutrophils Relative %: 59.1 % (ref 43.0–77.0)
Platelets: 217 10*3/uL (ref 150.0–400.0)
RBC: 4.27 Mil/uL (ref 3.87–5.11)
RDW: 13.9 % (ref 11.5–15.5)
WBC: 6.1 10*3/uL (ref 4.0–10.5)

## 2022-11-20 LAB — SEDIMENTATION RATE: Sed Rate: 23 mm/h (ref 0–30)

## 2022-11-20 MED ORDER — AMOXICILLIN 875 MG PO TABS
875.0000 mg | ORAL_TABLET | Freq: Two times a day (BID) | ORAL | 0 refills | Status: AC
Start: 2022-11-20 — End: 2022-11-30

## 2022-11-20 MED ORDER — PREDNISONE 20 MG PO TABS
40.0000 mg | ORAL_TABLET | Freq: Every day | ORAL | 0 refills | Status: DC
Start: 2022-11-20 — End: 2022-11-26

## 2022-11-20 NOTE — Patient Instructions (Signed)
Please go downstairs for labs.   Take the antibiotic and oral steroids by mouth with food and plenty of water.   You may also take Tylenol if needed but limit it.   Follow up with any new or worsening symptoms.

## 2022-11-20 NOTE — Progress Notes (Signed)
Subjective:     [name], female    DOB: 1950/05/14, 72 y.o.   MRN: [id number]  Chief Complaint  Patient presents with   Headache    Got sick end of August and ran low grade fever, headache and cough for 2 weeks but every day has a headache. Reports its inside her head and front is tender to touch, has been going on every day for last week and half    HPI  Discussed the use of AI scribe software for clinical note transcription with the patient, who gave verbal consent to proceed.  History of Present Illness          C/o daily headache x 10 days. Left sided frontal/temporal headache. Thick nasal drainage.  Dull ache of left frontal sinus. Left upper teeth also are painful. No tooth sensitivity.  No dizziness or vision changes.  Does not wake up with headache. Comes on mid morning. Takes medication and it goes away and returns in the evening    No ear pain, ST, neck pain, numbness, tingling or weakness.   Taking Tylenol or Advil daily.     Health Maintenance Due  Topic Date Due   DEXA SCAN  07/04/2022   INFLUENZA VACCINE  09/18/2022   Medicare Annual Wellness (AWV)  11/19/2022    Past Medical History:  Diagnosis Date   Abnormal Pap smear of cervix    COVID 08/2018   Hearing loss since childhood   bilateral hearing loss   Hyperlipidemia    Phreesia 03/25/2020   Irregular menses    PONV (postoperative nausea and vomiting)    SCC (squamous cell carcinoma), arm, right    Skin lesion of left leg 01/2007   atypical    Past Surgical History:  Procedure Laterality Date   BREAST LUMPECTOMY Left 1995   benign   COLONOSCOPY  07/04/2005   recheck in 10 years - Dr Loreta Ave   COLPOSCOPY  02/1996   benign   COLPOSCOPY  12/1998   benign   MASS EXCISION Right 11/18/2016   Procedure: EXCISIONAL BIOPSY MASS RIGHT LST WEB;  Surgeon: Cindee Salt, MD;  Location: [facility];  Service: Orthopedics;  Laterality: Right;  REG/FAB   TUBAL LIGATION  Bilateral 1991    Family History  Problem Relation Age of Onset   Hypertension Father    Diabetes Paternal Grandmother    Hypertension Mother    Thyroid disease Mother    Hypertension Brother    Cancer Paternal Aunt 4       colon cancer   Breast cancer Paternal Aunt     Social History   Socioeconomic History   Marital status: Married    Spouse name: Not on file   Number of children: Not on file   Years of education: Not on file   Highest education level: Bachelor's degree (e.g., BA, AB, BS)  Occupational History   Not on file  Tobacco Use   Smoking status: Never   Smokeless tobacco: Never  Vaping Use   Vaping status: Never Used  Substance and Sexual Activity   Alcohol use: No   Drug use: No   Sexual activity: Yes    Birth control/protection: Surgical    Comment: BTL  Other Topics Concern   Not on file  Social History Narrative   ** Merged History Encounter **       Social Determinants of Health   Financial Resource Strain: Low Risk  (08/12/2022)  Overall Financial Resource Strain (CARDIA)    Difficulty of Paying Living Expenses: Not hard at all  Food Insecurity: No Food Insecurity (08/12/2022)   Hunger Vital Sign    Worried About Running Out of Food in the Last Year: Never true    Ran Out of Food in the Last Year: Never true  Transportation Needs: No Transportation Needs (08/12/2022)   PRAPARE - Administrator, Civil Service (Medical): No    Lack of Transportation (Non-Medical): No  Physical Activity: Sufficiently Active (08/12/2022)   Exercise Vital Sign    Days of Exercise per Week: 5 days    Minutes of Exercise per Session: 60 min  Stress: No Stress Concern Present (08/12/2022)   [employer] of Occupational Health - Occupational Stress Questionnaire    Feeling of Stress : Only a little  Social Connections: Socially Integrated (08/12/2022)   Social Connection and Isolation Panel [NHANES]    Frequency of Communication with Friends and  Family: More than three times a week    Frequency of Social Gatherings with Friends and Family: Twice a week    Attends Religious Services: More than 4 times per year    Active Member of [employer] or Organizations: Yes    Attends Engineer, structural: More than 4 times per year    Marital Status: Married  Catering manager Violence: Not At Risk (11/18/2021)   Humiliation, Afraid, Rape, and Kick questionnaire    Fear of Current or Ex-Partner: No    Emotionally Abused: No    Physically Abused: No    Sexually Abused: No    Outpatient Medications Prior to Visit  Medication Sig Dispense Refill   acetaminophen (TYLENOL) 500 MG tablet Take 500 mg by mouth every 6 (six) hours as needed.     Calcium Carbonate-Vitamin D 600-5 MG-MCG TABS SMARTSIG:1 By Mouth     CALCIUM PO Take by mouth.     diazepam (VALIUM) 5 MG tablet Take 1 tablet 1 hour before dental procedure 3 tablet 0   hydrocortisone 2.5 % cream Apply topically as directed.     ibuprofen (ADVIL,MOTRIN) 200 MG tablet Take 200 mg by mouth every 6 (six) hours as needed.     Lactobacillus (PROBIOTIC ACIDOPHILUS PO) Take by mouth.     Multiple Vitamin (MULTIVITAMIN) tablet Take 1 tablet by mouth daily.     Red Yeast Rice Extract (RED YEAST RICE PO) Take by mouth.     Vitamin D, Cholecalciferol, 25 MCG (1000 UT) CAPS Take 1 capsule by mouth daily. (Patient taking differently: Take 2 capsules by mouth daily.) 60 capsule    promethazine-dextromethorphan (PROMETHAZINE-DM) 6.25-15 MG/5ML syrup Take 5 mLs by mouth every 8 (eight) hours as needed for cough. (Patient not taking: Reported on 11/20/2022) 118 mL 0   No facility-administered medications prior to visit.    No Known Allergies  Review of Systems  Constitutional:  Negative for chills and fever.  HENT:  Positive for sinus pain. Negative for congestion, ear pain and sore throat.   Respiratory:  Negative for shortness of breath.   Cardiovascular:  Negative for chest pain, palpitations  and leg swelling.  Gastrointestinal:  Negative for abdominal pain, constipation, diarrhea, nausea and vomiting.  Genitourinary:  Negative for dysuria, frequency and urgency.  Neurological:  Negative for dizziness.       Objective:    Physical Exam Constitutional:      General: She is not in acute distress.    Appearance: She is not ill-appearing.  HENT:     Head: Atraumatic.     Jaw: No tenderness or pain on movement.     Comments: No temporal artery fullness or tenderness     Right Ear: Tympanic membrane, ear canal and external ear normal. No mastoid tenderness.     Left Ear: Tympanic membrane, ear canal and external ear normal. No mastoid tenderness.     Nose:     Right Sinus: No maxillary sinus tenderness or frontal sinus tenderness.     Left Sinus: Frontal sinus tenderness present. No maxillary sinus tenderness.     Mouth/Throat:     Lips: Pink.     Mouth: Mucous membranes are moist.     Tongue: No lesions.     Pharynx: Oropharynx is clear.     Tonsils: No tonsillar exudate.  Eyes:     Extraocular Movements: Extraocular movements intact.     Conjunctiva/sclera: Conjunctivae normal.     Pupils: Pupils are equal, round, and reactive to light.  Cardiovascular:     Rate and Rhythm: Normal rate and regular rhythm.  Pulmonary:     Effort: Pulmonary effort is normal.     Breath sounds: Normal breath sounds.  Musculoskeletal:     Cervical back: Normal range of motion and neck supple. No tenderness.  Lymphadenopathy:     Cervical: No cervical adenopathy.  Skin:    General: Skin is warm and dry.     Findings: No erythema or rash.  Neurological:     General: No focal deficit present.     Mental Status: She is alert and oriented to person, place, and time.     Cranial Nerves: No cranial nerve deficit.     Sensory: No sensory deficit.     Motor: No weakness.     Coordination: Coordination normal.     Gait: Gait normal.  Psychiatric:        Mood and Affect: Mood normal.         Behavior: Behavior normal.        Thought Content: Thought content normal.      BP 110/76 (BP Location: Left Arm, Patient Position: Sitting, Cuff Size: Normal)   Pulse 75   Temp 97.8 F (36.6 C) (Temporal)   Ht 5' 7.25" (1.708 m)   Wt 163 lb (73.9 kg)   LMP 11/17/2001 (Approximate)   SpO2 97%   BMI 25.34 kg/m  Wt Readings from Last 3 Encounters:  11/20/22 163 lb (73.9 kg)  08/14/22 164 lb (74.4 kg)  05/27/22 164 lb 8 oz (74.6 kg)       Assessment & Plan:   Problem List Items Addressed This Visit   None Visit Diagnoses     Acute non intractable tension-type headache    -  Primary   Relevant Medications   predniSONE (DELTASONE) 20 MG tablet   Other Relevant Orders   CBC with Differential/Platelet (Completed)   Sedimentation rate (Completed)   Acute non-recurrent frontal sinusitis       Relevant Medications   amoxicillin (AMOXIL) 875 MG tablet   predniSONE (DELTASONE) 20 MG tablet      Suspect sinusitis. Amoxicillin and oral prednisone prescribed.  CBC and sed rate ordered.  Follow up pending labs and with any new or worsening symptoms.   I have discontinued Renee Pain. Truett's promethazine-dextromethorphan. I am also having her start on amoxicillin and predniSONE. Additionally, I am having her maintain her multivitamin, Lactobacillus (PROBIOTIC ACIDOPHILUS PO), ibuprofen, acetaminophen, diazepam, Vitamin D (Cholecalciferol), Red Yeast Rice  Extract (RED YEAST RICE PO), CALCIUM PO, Calcium Carbonate-Vitamin D, and hydrocortisone.  Meds ordered this encounter  Medications   amoxicillin (AMOXIL) 875 MG tablet    Sig: Take 1 tablet (875 mg total) by mouth 2 (two) times daily for 10 days.    Dispense:  20 tablet    Refill:  0    Order Specific Question:   Supervising Provider    Answer:   Hillard Danker A [[id number]]   predniSONE (DELTASONE) 20 MG tablet    Sig: Take 2 tablets (40 mg total) by mouth daily with breakfast.    Dispense:  10 tablet    Refill:  0     Order Specific Question:   Supervising Provider    Answer:   Hillard Danker A [[id number]]

## 2022-11-26 ENCOUNTER — Encounter: Payer: Self-pay | Admitting: Obstetrics and Gynecology

## 2022-11-26 ENCOUNTER — Other Ambulatory Visit (HOSPITAL_COMMUNITY)
Admission: RE | Admit: 2022-11-26 | Discharge: 2022-11-26 | Disposition: A | Discharge status: Home / Self Care | Payer: Medicare HMO | Source: Ambulatory Visit | Attending: Obstetrics and Gynecology | Admitting: Obstetrics and Gynecology

## 2022-11-26 ENCOUNTER — Telehealth: Payer: Self-pay | Admitting: Obstetrics and Gynecology

## 2022-11-26 ENCOUNTER — Ambulatory Visit (INDEPENDENT_AMBULATORY_CARE_PROVIDER_SITE_OTHER): Payer: Medicare HMO | Admitting: Obstetrics and Gynecology

## 2022-11-26 VITALS — BP 124/68 | HR 71 | Ht 67.0 in | Wt 163.0 lb

## 2022-11-26 DIAGNOSIS — Z124 Encounter for screening for malignant neoplasm of cervix: Secondary | ICD-10-CM

## 2022-11-26 DIAGNOSIS — Z01419 Encounter for gynecological examination (general) (routine) without abnormal findings: Secondary | ICD-10-CM

## 2022-11-26 DIAGNOSIS — M8580 Other specified disorders of bone density and structure, unspecified site: Secondary | ICD-10-CM | POA: Diagnosis not present

## 2022-11-26 DIAGNOSIS — Z1151 Encounter for screening for human papillomavirus (HPV): Secondary | ICD-10-CM | POA: Insufficient documentation

## 2022-11-26 NOTE — Telephone Encounter (Signed)
Routing to [employer].

## 2022-11-26 NOTE — Patient Instructions (Signed)

## 2022-11-26 NOTE — Telephone Encounter (Signed)
Please fax order to [facility] for bone density for my patient, who is menopausal and has osteopenia.   She would like to schedule her appointment herself for after Jan. 1, 2025.

## 2022-11-27 NOTE — Telephone Encounter (Signed)
Began order form for BD. Waiting for signature to fax

## 2022-11-28 LAB — CYTOLOGY - PAP
Comment: NEGATIVE
Diagnosis: NEGATIVE
High risk HPV: NEGATIVE

## 2022-12-01 NOTE — Telephone Encounter (Signed)
Order signed

## 2022-12-02 ENCOUNTER — Ambulatory Visit: Payer: Medicare HMO | Admitting: Radiology

## 2022-12-02 DIAGNOSIS — Z23 Encounter for immunization: Secondary | ICD-10-CM | POA: Diagnosis not present

## 2022-12-02 NOTE — Progress Notes (Signed)
Patient here for HD flu shot, patient tolerated well with no complications.

## 2023-02-20 DIAGNOSIS — H04122 Dry eye syndrome of left lacrimal gland: Secondary | ICD-10-CM | POA: Diagnosis not present

## 2023-02-20 DIAGNOSIS — H10412 Chronic giant papillary conjunctivitis, left eye: Secondary | ICD-10-CM | POA: Diagnosis not present

## 2023-03-02 DIAGNOSIS — M8588 Other specified disorders of bone density and structure, other site: Secondary | ICD-10-CM | POA: Diagnosis not present

## 2023-03-02 LAB — HM DEXA SCAN

## 2023-03-03 ENCOUNTER — Encounter: Payer: Self-pay | Admitting: Nurse Practitioner

## 2023-03-07 ENCOUNTER — Encounter: Payer: Self-pay | Admitting: Nurse Practitioner

## 2023-03-25 DIAGNOSIS — M25561 Pain in right knee: Secondary | ICD-10-CM | POA: Diagnosis not present

## 2023-04-23 DIAGNOSIS — M25561 Pain in right knee: Secondary | ICD-10-CM | POA: Diagnosis not present

## 2023-06-03 ENCOUNTER — Ambulatory Visit: Payer: Medicare HMO

## 2023-06-03 VITALS — Ht 67.5 in | Wt 163.0 lb

## 2023-06-03 DIAGNOSIS — Z0000 Encounter for general adult medical examination without abnormal findings: Secondary | ICD-10-CM | POA: Diagnosis not present

## 2023-06-03 NOTE — Patient Instructions (Signed)
 Ms. Valenza , Thank you for taking time to come for your Medicare Wellness Visit. I appreciate your ongoing commitment to your health goals. Please review the following plan we discussed and let me know if I can assist you in the future.   Referrals/Orders/Follow-Ups/Clinician Recommendations: Aim for 30 minutes of exercise or brisk walking, 6-8 glasses of water, and 5 servings of fruits and vegetables each day.   This is a list of the screening recommended for you and due dates:  Health Maintenance  Topic Date Due   Flu Shot  09/18/2023   Mammogram  11/04/2023   Medicare Annual Wellness Visit  06/02/2024   DEXA scan (bone density measurement)  03/01/2025   Colon Cancer Screening  08/23/2025   DTaP/Tdap/Td vaccine (4 - Td or Tdap) 07/24/2030   Pneumonia Vaccine  Completed   Hepatitis C Screening  Completed   Zoster (Shingles) Vaccine  Completed   HPV Vaccine  Aged Out   Meningitis B Vaccine  Aged Out   COVID-19 Vaccine  Discontinued    Advanced directives: (Copy Requested) Please bring a copy of your health care power of attorney and living will to the office to be added to your chart at your convenience. You can mail to [facility] [address] [place], [place] [address] or email to [email or web address]  Next Medicare Annual Wellness Visit scheduled for next year: Yes

## 2023-06-03 NOTE — Progress Notes (Signed)
 Subjective:   Rebecca Howe is a 73 y.o. who presents for a Medicare Wellness preventive visit.  Visit Complete: Virtual I connected with  Rebecca Howe on 06/03/23 by a audio enabled telemedicine application and verified that I am speaking with the correct person using two identifiers.  Patient Location: Home  Provider Location: Office/Clinic  I discussed the limitations of evaluation and management by telemedicine. The patient expressed understanding and agreed to proceed.  Vital Signs: Because this visit was a virtual/telehealth visit, some criteria may be missing or patient reported. Any vitals not documented were not able to be obtained and vitals that have been documented are patient reported.  VideoDeclined- This patient declined Librarian, academic. Therefore the visit was completed with audio only.  Persons Participating in Visit: Patient.  AWV Questionnaire: No: Patient Medicare AWV questionnaire was not completed prior to this visit.  Cardiac Risk Factors include: advanced age (>41men, >14 women)     Objective:    Today's Vitals   06/03/23 1238  Weight: 163 lb (73.9 kg)  Height: 5' 7.5" (1.715 m)   Body mass index is 25.15 kg/m.     06/03/2023   12:38 PM 11/18/2021   10:39 AM 03/28/2020    8:35 AM 03/28/2019    8:22 AM 03/23/2018    8:29 AM 11/18/2016    9:27 AM 11/13/2016    1:17 PM  Advanced Directives  Does Patient Have a Medical Advance Directive? Yes Yes No No No No No  Type of Estate agent of [place];Living will Healthcare Power of [place];Living will       Copy of Healthcare Power of Attorney in Chart? No - copy requested No - copy requested       Would patient like information on creating a medical advance directive?   Yes (MAU/Ambulatory/Procedural Areas - Information given) No - Patient declined No - Patient declined No - Patient declined     Current Medications (verified) Outpatient Encounter  Medications as of 06/03/2023  Medication Sig   acetaminophen (TYLENOL) 500 MG tablet Take 500 mg by mouth every 6 (six) hours as needed.   Calcium Carbonate-Vitamin D 600-5 MG-MCG TABS SMARTSIG:1 By Mouth   CALCIUM PO Take by mouth.   diazepam (VALIUM) 5 MG tablet Take 1 tablet 1 hour before dental procedure   hydrocortisone 2.5 % cream Apply topically as directed.   ibuprofen (ADVIL,MOTRIN) 200 MG tablet Take 200 mg by mouth every 6 (six) hours as needed.   Lactobacillus (PROBIOTIC ACIDOPHILUS PO) Take by mouth.   Multiple Vitamin (MULTIVITAMIN) tablet Take 1 tablet by mouth daily.   Red Yeast Rice Extract (RED YEAST RICE PO) Take by mouth.   Turmeric (QC TUMERIC COMPLEX PO) Take 1,200 capsules by mouth in the morning and at bedtime.   Vitamin D, Cholecalciferol, 25 MCG (1000 UT) CAPS Take 1 capsule by mouth daily. (Patient taking differently: Take 2 capsules by mouth daily.)   No facility-administered encounter medications on file as of 06/03/2023.    Allergies (verified) Patient has no known allergies.   History: Past Medical History:  Diagnosis Date   Abnormal Pap smear of cervix    COVID 08/2018   Hearing loss since childhood   bilateral hearing loss   Hyperlipidemia    Phreesia 03/25/2020   Irregular menses    PONV (postoperative nausea and vomiting)    SCC (squamous cell carcinoma), arm, right    Skin lesion of left leg 01/2007   atypical  Past Surgical History:  Procedure Laterality Date   BREAST LUMPECTOMY Left 1995   benign   COLONOSCOPY  07/04/2005   recheck in 10 years - Dr Loreta Ave   COLPOSCOPY  02/1996   benign   COLPOSCOPY  12/1998   benign   MASS EXCISION Right 11/18/2016   Procedure: EXCISIONAL BIOPSY MASS RIGHT LST WEB;  Surgeon: Cindee Salt, MD;  Location: [facility];  Service: Orthopedics;  Laterality: Right;  REG/FAB   TUBAL LIGATION Bilateral 1991   Family History  Problem Relation Age of Onset   Hypertension Father    Diabetes Paternal  Grandmother    Hypertension Mother    Thyroid disease Mother    Hypertension Brother    Cancer Paternal Aunt 67       colon cancer   Breast cancer Paternal Aunt    Social History   Socioeconomic History   Marital status: Married    Spouse name: Not on file   Number of children: Not on file   Years of education: Not on file   Highest education level: Bachelor's degree (e.g., BA, AB, BS)  Occupational History   Not on file  Tobacco Use   Smoking status: Never    Passive exposure: Never   Smokeless tobacco: Never  Vaping Use   Vaping status: Never Used  Substance and Sexual Activity   Alcohol use: No   Drug use: No   Sexual activity: Yes    Birth control/protection: Surgical    Comment: BTL; less than 5, IC after 16, no STD, no abnormal pap  Other Topics Concern   Not on file  Social History Narrative   ** Merged History Encounter **    Married   Social Drivers of Corporate investment banker Strain: Low Risk  (06/03/2023)   Overall Financial Resource Strain (CARDIA)    Difficulty of Paying Living Expenses: Not hard at all  Food Insecurity: No Food Insecurity (06/03/2023)   Hunger Vital Sign    Worried About Running Out of Food in the Last Year: Never true    Ran Out of Food in the Last Year: Never true  Transportation Needs: No Transportation Needs (06/03/2023)   PRAPARE - Administrator, Civil Service (Medical): No    Lack of Transportation (Non-Medical): No  Physical Activity: Sufficiently Active (06/03/2023)   Exercise Vital Sign    Days of Exercise per Week: 4 days    Minutes of Exercise per Session: 60 min  Stress: No Stress Concern Present (06/03/2023)   [employer] of Occupational Health - Occupational Stress Questionnaire    Feeling of Stress : Not at all  Social Connections: Socially Integrated (06/03/2023)   Social Connection and Isolation Panel [NHANES]    Frequency of Communication with Friends and Family: More than three times a week     Frequency of Social Gatherings with Friends and Family: More than three times a week    Attends Religious Services: More than 4 times per year    Active Member of [employer] or Organizations: Yes    Attends Engineer, structural: More than 4 times per year    Marital Status: Married    Tobacco Counseling Counseling given: No    Clinical Intake:  Pre-visit preparation completed: Yes  Pain : No/denies pain     BMI - recorded: 25.15 Nutritional Status: BMI 25 -29 Overweight Nutritional Risks: None Diabetes: No  Lab Results  Component Value Date   HGBA1C 5.6 07/04/2021  How often do you need to have someone help you when you read instructions, pamphlets, or other written materials from your doctor or pharmacy?: 1 - Never  Interpreter Needed?: No  Information entered by :: Hassell Halim, CMA   Activities of Daily Living     06/03/2023   12:42 PM  In your present state of health, do you have any difficulty performing the following activities:  Hearing? 0  Vision? 0  Difficulty concentrating or making decisions? 0  Walking or climbing stairs? 0  Dressing or bathing? 0  Doing errands, shopping? 0  Preparing Food and eating ? N  Using the Toilet? N  In the past six months, have you accidently leaked urine? N  Do you have problems with loss of bowel control? N  Managing your Medications? N  Managing your Finances? N  Housekeeping or managing your Housekeeping? N    Patient Care Team: Elenore Paddy, NP as PCP - General (Nurse Practitioner) Patton Salles, MD as Consulting Physician (Obstetrics and Gynecology) Antony Contras, MD as Consulting Physician (Ophthalmology)  Indicate any recent Medical Services you may have received from other than [facility] providers in the past year (date may be approximate).     Assessment:   This is a routine wellness examination for [place].  Hearing/Vision screen Hearing Screening - Comments:: Wears hearing aids [facility] Vision Screening - Comments:: Wears rx glasses - up to date with routine eye exams with Dr Randon Goldsmith   Goals Addressed               This Visit's Progress     Patient Stated (pt-stated)        Patient stated she plans to stay active for her grandchildren.       Depression Screen     06/03/2023   12:45 PM 11/20/2022    1:23 PM 08/14/2022    8:07 AM 05/27/2022    2:45 PM 05/27/2022    2:44 PM 02/07/2022    8:06 AM 11/18/2021   10:38 AM  PHQ 2/9 Scores  PHQ - 2 Score 0 0 0 0 0 0 0  PHQ- 9 Score 0   0       Fall Risk     06/03/2023   12:43 PM 11/20/2022    1:23 PM 08/14/2022    8:07 AM 05/27/2022    2:44 PM 02/07/2022    8:06 AM  Fall Risk   Falls in the past year? 0 0 0 0 0  Number falls in past yr: 0 0 0 0 0  Injury with Fall? 0 0 0 0 0  Risk for fall due to : No Fall Risks No Fall Risks No Fall Risks  No Fall Risks  Follow up Falls prevention discussed;Falls evaluation completed Falls evaluation completed Falls evaluation completed Falls evaluation completed Falls evaluation completed    MEDICARE RISK AT HOME:  Medicare Risk at Home Any stairs in or around the home?: Yes If so, are there any without handrails?: No Home free of loose throw rugs in walkways, pet beds, electrical cords, etc?: Yes Adequate lighting in your home to reduce risk of falls?: Yes Life alert?: No Use of a cane, walker or w/c?: No Grab bars in the bathroom?: Yes Shower chair or bench in shower?: Yes Elevated toilet seat or a handicapped toilet?: Yes  TIMED UP AND GO:  Was the test performed?  No  Cognitive Function: 6CIT completed  06/03/2023   12:45 PM  6CIT Screen  What Year? 0 points  What month? 0 points  What time? 0 points  Count back from 20 0 points  Months in reverse 0 points  Repeat phrase 0 points  Total Score 0 points    Immunizations Immunization History  Administered Date(s) Administered   Fluad Quad(high Dose 65+) 11/18/2019, 11/21/2020,  11/12/2021   Fluad Trivalent(High Dose 65+) 12/02/2022   Influenza, High Dose Seasonal PF 11/27/2017   Influenza,inj,Quad PF,6+ Mos 11/25/2012, 11/17/2013, 12/05/2014, 12/06/2015, 11/11/2016, 12/06/2018   Pneumococcal Conjugate-13 01/20/2017   Pneumococcal Polysaccharide-23 03/23/2018   Tdap 07/19/2002, 07/29/2010, 07/23/2020   Zoster Recombinant(Shingrix) 03/06/2017, 05/18/2017   Zoster, Live 09/19/2011    Screening Tests Health Maintenance  Topic Date Due   INFLUENZA VACCINE  09/18/2023   MAMMOGRAM  11/04/2023   Medicare Annual Wellness (AWV)  06/02/2024   DEXA SCAN  03/01/2025   Colonoscopy  08/23/2025   DTaP/Tdap/Td (4 - Td or Tdap) 07/24/2030   Pneumonia Vaccine 5+ Years old  Completed   Hepatitis C Screening  Completed   Zoster Vaccines- Shingrix  Completed   HPV VACCINES  Aged Out   Meningococcal B Vaccine  Aged Out   COVID-19 Vaccine  Discontinued    Health Maintenance  There are no preventive care reminders to display for this patient.  Health Maintenance Items Addressed:06/03/2023   Additional Screening:  Vision Screening: Recommended annual ophthalmology exams for early detection of glaucoma and other disorders of the eye.  Dental Screening: Recommended annual dental exams for proper oral hygiene  Community Resource Referral / Chronic Care Management: CRR required this visit?  No   CCM required this visit?  No     Plan:     I have personally reviewed and noted the following in the patient's chart:   Medical and social history Use of alcohol, tobacco or illicit drugs  Current medications and supplements including opioid prescriptions. Patient is not currently taking opioid prescriptions. Functional ability and status Nutritional status Physical activity Advanced directives List of other physicians Hospitalizations, surgeries, and ER visits in previous 12 months Vitals Screenings to include cognitive, depression, and falls Referrals and  appointments  In addition, I have reviewed and discussed with patient certain preventive protocols, quality metrics, and best practice recommendations. A written personalized care plan for preventive services as well as general preventive health recommendations were provided to patient.     Patria Bookbinder, CMA   06/03/2023   After Visit Summary: (MyChart) Due to this being a telephonic visit, the after visit summary with patients personalized plan was offered to patient via MyChart   Notes: Nothing significant to report at this time.

## 2023-07-29 DIAGNOSIS — M25571 Pain in right ankle and joints of right foot: Secondary | ICD-10-CM | POA: Diagnosis not present

## 2023-07-30 ENCOUNTER — Encounter: Payer: Medicare HMO | Admitting: Nurse Practitioner

## 2023-08-13 ENCOUNTER — Encounter: Payer: Medicare HMO | Admitting: Nurse Practitioner

## 2023-08-20 ENCOUNTER — Other Ambulatory Visit: Payer: Self-pay | Admitting: Nurse Practitioner

## 2023-08-20 NOTE — Telephone Encounter (Unsigned)
 Copied from CRM [id number]. Topic: Clinical - Medication Refill >> Aug 20, 2023  2:18 PM Robinson H wrote: Medication: diazepam  (VALIUM ) 5 MG tablet  Has the patient contacted their pharmacy? No, original rx filled in 2018 by different provider (Agent: If no, request that the patient contact the pharmacy for the refill. If patient does not wish to contact the pharmacy document the reason why and proceed with request.) (Agent: If yes, when and what did the pharmacy advise?)  This is the patient's preferred pharmacy:  [id number] [place], [place] - [address] [place] [address] Phone: [phone] Fax: [phone]  Is this the correct pharmacy for this prescription? Yes If no, delete pharmacy and type the correct one.   Has the prescription been filled recently? No  Is the patient out of the medication? Yes  Has the patient been seen for an appointment in the last year OR does the patient have an upcoming appointment? Yes  Can we respond through MyChart? Yes  Agent: Please be advised that Rx refills may take up to 3 business days. We ask that you follow-up with your pharmacy.

## 2023-08-24 ENCOUNTER — Other Ambulatory Visit: Payer: Self-pay | Admitting: Family

## 2023-08-24 MED ORDER — DIAZEPAM 5 MG PO TABS: ORAL_TABLET | ORAL | 0 refills | Status: DC

## 2023-09-14 ENCOUNTER — Ambulatory Visit
Admission: EM | Admit: 2023-09-14 | Discharge: 2023-09-14 | Disposition: A | Discharge status: Home / Self Care | Attending: Emergency Medicine | Admitting: Emergency Medicine

## 2023-09-14 ENCOUNTER — Encounter: Payer: Self-pay | Admitting: Emergency Medicine

## 2023-09-14 DIAGNOSIS — L03011 Cellulitis of right finger: Secondary | ICD-10-CM

## 2023-09-14 MED ORDER — CEPHALEXIN 500 MG PO CAPS: 500.0000 mg | ORAL_CAPSULE | Freq: Four times a day (QID) | ORAL | 0 refills | Status: DC

## 2023-09-14 NOTE — ED Provider Notes (Signed)
 Rebecca Howe    CSN: [id number] Arrival date & time: 09/14/23  1546      History   Chief Complaint Chief Complaint  Patient presents with   Finger Injury    HPI Rebecca Howe is a 73 y.o. female.   Patient presents for evaluation of a possible foreign body underneath the right thumbnail beginning 1 day ago after she was scraped against something in her cabinet, unsure of presence of the splinter but ended to remove her own.  Endorses a throbbing pain that awoke her from her sleep overnight.  Has attempted to remove but unsuccessful.  Past Medical History:  Diagnosis Date   Abnormal Pap smear of cervix    COVID 08/2018   Hearing loss since childhood   bilateral hearing loss   Hyperlipidemia    Phreesia 03/25/2020   Irregular menses    PONV (postoperative nausea and vomiting)    SCC (squamous cell carcinoma), arm, right    Skin lesion of left leg 01/2007   atypical    Patient Active Problem List   Diagnosis Date Noted   Allergic conjunctivitis of left eye 05/27/2022   Family history of malignant neoplasm of digestive organs 05/20/2022   Decreased calculated GFR 08/08/2021   Encounter for screening mammogram for malignant neoplasm of breast 08/08/2021   Encounter for general adult medical examination with abnormal findings 08/08/2021   Elevated blood pressure reading 07/04/2021   Abdominal aortic ectasia (HCC) 03/28/2020   Aortic atherosclerosis (HCC) 03/23/2018   Hearing impaired person, bilateral 03/23/2018   Osteopenia 09/04/2017   Fibroma 11/26/2016   Vitamin D  deficiency 05/06/2013   Hyperlipidemia 02/04/2013    Past Surgical History:  Procedure Laterality Date   BREAST LUMPECTOMY Left 1995   benign   COLONOSCOPY  07/04/2005   recheck in 10 years - Dr Kristie   COLPOSCOPY  02/1996   benign   COLPOSCOPY  12/1998   benign   MASS EXCISION Right 11/18/2016   Procedure: EXCISIONAL BIOPSY MASS RIGHT LST WEB;  Surgeon: Murrell Kuba, MD;  Location: [facility];  Service: Orthopedics;  Laterality: Right;  REG/FAB   TUBAL LIGATION Bilateral 1991    OB History     Gravida  2   Para  2   Term  2   Preterm  0   AB  0   Living  2      SAB  0   IAB  0   Ectopic  0   Multiple  0   Live Births  2            Home Medications    Prior to Admission medications   Medication Sig Start Date End Date Taking? Authorizing Provider  cephALEXin  (KEFLEX ) 500 MG capsule Take 1 capsule (500 mg total) by mouth 4 (four) times daily. 09/14/23  Yes Kem Parcher R, NP  acetaminophen  (TYLENOL ) 500 MG tablet Take 500 mg by mouth every 6 (six) hours as needed.    [provider]  Calcium Carbonate-Vitamin D  600-5 MG-MCG TABS SMARTSIG:1 By Mouth 08/21/21   [provider]  CALCIUM PO Take by mouth.    [provider]  diazepam  (VALIUM ) 5 MG tablet Take 1 tablet 1 hour before dental procedure 08/24/23   Webb, Padonda B, FNP  hydrocortisone 2.5 % cream Apply topically as directed. 08/21/21   [provider]  ibuprofen (ADVIL,MOTRIN) 200 MG tablet Take 200 mg by mouth every 6 (six) hours as needed.  [provider]  Lactobacillus (PROBIOTIC ACIDOPHILUS PO) Take by mouth.    [provider]  Multiple Vitamin (MULTIVITAMIN) tablet Take 1 tablet by mouth daily.    [provider]  Red Yeast Rice Extract (RED YEAST RICE PO) Take by mouth.    [provider]  Turmeric (QC TUMERIC COMPLEX PO) Take 1,200 capsules by mouth in the morning and at bedtime.    [provider]  Vitamin D , Cholecalciferol , 25 MCG (1000 UT) CAPS Take 1 capsule by mouth daily. Patient taking differently: Take 2 capsules by mouth daily. 03/28/19   Bari Theodoro FALCON, MD    Family History Family History  Problem Relation Age of Onset   Hypertension Father    Diabetes Paternal Grandmother    Hypertension Mother    Thyroid  disease Mother    Hypertension Brother    Cancer Paternal Aunt  72       colon cancer   Breast cancer Paternal Aunt     Social History Social History   Tobacco Use   Smoking status: Never    Passive exposure: Never   Smokeless tobacco: Never  Vaping Use   Vaping status: Never Used  Substance Use Topics   Alcohol use: No   Drug use: No     Allergies   Patient has no known allergies.   Review of Systems Review of Systems   Physical Exam Triage Vital Signs ED Triage Vitals [09/14/23 1608]  Encounter Vitals Group     BP      Girls Systolic BP Percentile      Girls Diastolic BP Percentile      Boys Systolic BP Percentile      Boys Diastolic BP Percentile      Pulse      Resp      Temp      Temp src      SpO2      Weight      Height      Head Circumference      Peak Flow      Pain Score 4     Pain Loc      Pain Education      Exclude from Growth Chart    No data found.  Updated Vital Signs LMP 11/17/2001 (Approximate)   Visual Acuity Right Eye Distance:   Left Eye Distance:   Bilateral Distance:    Right Eye Near:   Left Eye Near:    Bilateral Near:     Physical Exam Constitutional:      Appearance: Normal appearance.  Eyes:     Extraocular Movements: Extraocular movements intact.  Pulmonary:     Effort: Pulmonary effort is normal.  Skin:    Comments: Yellow discoloration under the center and along the distal edge of the right thumb nail  Neurological:     Mental Status: She is alert and oriented to person, place, and time. Mental status is at baseline.      UC Treatments / Results  Labs (all labs ordered are listed, but only abnormal results are displayed) Labs Reviewed - No data to display  EKG   Radiology No results found.  Procedures Procedures (including critical care time)  Medications Ordered in UC Medications - No data to display  Initial Impression / Assessment and Plan / UC Course  I have reviewed the triage vital signs and the nursing notes.  Pertinent labs & imaging results  that were available during my care of the  patient were reviewed by me and considered in my medical decision making (see chart for details).  Paronychia of the right thumb  Attempted to remove foreign body with use of lidocaine  mist and a tweezer, no foreign body noted expelling yellow to Rebecca Howe purulent drainage, discussed with patient that this is infection, prescribed cephalexin  and recommended warm soaks as well as over-the-counter analgesics advised to monitor and follow-up for any delays in healing Final Clinical Impressions(s) / UC Diagnoses   Final diagnoses:  Paronychia of right thumb     Discharge Instructions      On exam when attempting to remove possible foreign body pus was expelled from the fingernail indicating infection  Take keflex  every 6 hours for 5 days  Soak the finger in warm water for 10 to 15-minute intervals throughout the day, this helps to soften tissue and it helps the area to drain  May take Tylenol  and or Motrin as needed for pain  Cleanse over the area daily with unscented soap and water, pat and do not rub, cover with a nonstick Band-Aid if at risk for becoming dirty  You have any concerns regarding healing may follow-up with urgent care or your primary doctor for reevaluation   ED Prescriptions     Medication Sig Dispense Auth. Provider   cephALEXin  (KEFLEX ) 500 MG capsule Take 1 capsule (500 mg total) by mouth 4 (four) times daily. 20 capsule Marishka Rentfrow R, NP      PDMP not reviewed this encounter.   Teresa Shelba SAUNDERS, NP 09/14/23 [phone]

## 2023-09-14 NOTE — ED Triage Notes (Signed)
 Patient complains of a splinter under right thumb x 1 day.

## 2023-09-14 NOTE — Discharge Instructions (Signed)
 On exam when attempting to remove possible foreign body pus was expelled from the fingernail indicating infection  Take keflex  every 6 hours for 5 days  Soak the finger in warm water for 10 to 15-minute intervals throughout the day, this helps to soften tissue and it helps the area to drain  May take Tylenol  and or Motrin as needed for pain  Cleanse over the area daily with unscented soap and water, pat and do not rub, cover with a nonstick Band-Aid if at risk for becoming dirty  You have any concerns regarding healing may follow-up with urgent care or your primary doctor for reevaluation

## 2023-09-16 NOTE — Telephone Encounter (Signed)
 Copied from CRM [id number]. Topic: Clinical - Medication Question >> Sep 16, 2023  4:08 PM Viola F wrote: Patient called to follow up on refill request for   diazepam  (VALIUM ) 5 MG tablet from 08/20/23, it was never sent to the pharmacy. She would like [facility] to [id number] [place], [place] - [address]

## 2023-09-17 ENCOUNTER — Encounter: Admitting: Nurse Practitioner

## 2023-10-15 ENCOUNTER — Encounter: Admitting: Nurse Practitioner

## 2023-10-26 DIAGNOSIS — H04123 Dry eye syndrome of bilateral lacrimal glands: Secondary | ICD-10-CM | POA: Diagnosis not present

## 2023-10-26 DIAGNOSIS — H5203 Hypermetropia, bilateral: Secondary | ICD-10-CM | POA: Diagnosis not present

## 2023-10-26 DIAGNOSIS — H2513 Age-related nuclear cataract, bilateral: Secondary | ICD-10-CM | POA: Diagnosis not present

## 2023-11-02 DIAGNOSIS — H01115 Allergic dermatitis of left lower eyelid: Secondary | ICD-10-CM | POA: Diagnosis not present

## 2023-11-02 DIAGNOSIS — H01114 Allergic dermatitis of left upper eyelid: Secondary | ICD-10-CM | POA: Diagnosis not present

## 2023-11-05 DIAGNOSIS — Z1231 Encounter for screening mammogram for malignant neoplasm of breast: Secondary | ICD-10-CM | POA: Diagnosis not present

## 2023-11-05 LAB — HM MAMMOGRAPHY

## 2023-11-10 ENCOUNTER — Encounter: Payer: Self-pay | Admitting: Nurse Practitioner

## 2023-11-23 ENCOUNTER — Ambulatory Visit

## 2023-11-23 DIAGNOSIS — Z23 Encounter for immunization: Secondary | ICD-10-CM

## 2023-11-23 NOTE — Progress Notes (Signed)
 After obtaining consent, and per orders of Lauraine Pereyra NP, injection of HDFLU given by Ronnald SHAUNNA Palms. Patient  to report any adverse reaction to me immediately.

## 2023-12-10 ENCOUNTER — Ambulatory Visit: Admitting: Nurse Practitioner

## 2023-12-10 VITALS — BP 120/80 | HR 75 | Temp 98.1°F | Ht 67.5 in | Wt 165.0 lb

## 2023-12-10 DIAGNOSIS — E559 Vitamin D deficiency, unspecified: Secondary | ICD-10-CM

## 2023-12-10 DIAGNOSIS — E7800 Pure hypercholesterolemia, unspecified: Secondary | ICD-10-CM | POA: Diagnosis not present

## 2023-12-10 DIAGNOSIS — Z0001 Encounter for general adult medical examination with abnormal findings: Secondary | ICD-10-CM

## 2023-12-10 DIAGNOSIS — I700 Atherosclerosis of aorta: Secondary | ICD-10-CM

## 2023-12-10 DIAGNOSIS — R4589 Other symptoms and signs involving emotional state: Secondary | ICD-10-CM

## 2023-12-10 DIAGNOSIS — Z0000 Encounter for general adult medical examination without abnormal findings: Secondary | ICD-10-CM | POA: Diagnosis not present

## 2023-12-10 LAB — COMPREHENSIVE METABOLIC PANEL WITH GFR
ALT: 15 U/L (ref 0–35)
AST: 26 U/L (ref 0–37)
Albumin: 4.3 g/dL (ref 3.5–5.2)
Alkaline Phosphatase: 87 U/L (ref 39–117)
BUN: 14 mg/dL (ref 6–23)
CO2: 31 meq/L (ref 19–32)
Calcium: 9.5 mg/dL (ref 8.4–10.5)
Chloride: 101 meq/L (ref 96–112)
Creatinine, Ser: 0.86 mg/dL (ref 0.40–1.20)
GFR: 67.58 mL/min (ref >60.00)
Glucose, Bld: 86 mg/dL (ref 70–99)
Potassium: 4.2 meq/L (ref 3.5–5.1)
Sodium: 140 meq/L (ref 135–145)
Total Bilirubin: 0.4 mg/dL (ref 0.2–1.2)
Total Protein: 7.1 g/dL (ref 6.0–8.3)

## 2023-12-10 LAB — LIPID PANEL
Cholesterol: 215 mg/dL — ABNORMAL HIGH (ref 0–200)
HDL: 52.4 mg/dL (ref >39.00)
LDL Cholesterol: 143 mg/dL — ABNORMAL HIGH (ref 0–99)
NonHDL: 163.05
Total CHOL/HDL Ratio: 4
Triglycerides: 99 mg/dL (ref 0.0–149.0)
VLDL: 19.8 mg/dL (ref 0.0–40.0)

## 2023-12-10 LAB — HEMOGLOBIN A1C: Hgb A1c MFr Bld: 5.8 % (ref 4.6–6.5)

## 2023-12-10 LAB — CBC
HCT: 39.2 % (ref 36.0–46.0)
Hemoglobin: 12.8 g/dL (ref 12.0–15.0)
MCHC: 32.6 g/dL (ref 30.0–36.0)
MCV: 89.1 fl (ref 78.0–100.0)
Platelets: 214 K/uL (ref 150.0–400.0)
RBC: 4.4 Mil/uL (ref 3.87–5.11)
RDW: 13.2 % (ref 11.5–15.5)
WBC: 6.1 K/uL (ref 4.0–10.5)

## 2023-12-10 LAB — VITAMIN D 25 HYDROXY (VIT D DEFICIENCY, FRACTURES): VITD: 48.65 ng/mL (ref 30.00–100.00)

## 2023-12-10 LAB — TSH: TSH: 2.48 u[IU]/mL (ref 0.35–5.50)

## 2023-12-10 MED ORDER — DIAZEPAM 5 MG PO TABS: ORAL_TABLET | ORAL | 0 refills | Status: AC

## 2023-12-10 NOTE — Assessment & Plan Note (Signed)
 Discussed routine screenings and handout provided.

## 2023-12-10 NOTE — Progress Notes (Signed)
 Complete physical exam  Patient: Rebecca Howe   DOB: Jul 17, 1950   73 y.o. Female  MRN: [id number]  Subjective:    Chief Complaint  Patient presents with   Annual Exam    Rebecca Howe is a 73 y.o. female who presents today for a complete physical exam.  Discussed the use of AI scribe software for clinical note transcription with the patient, who gave verbal consent to proceed.  History of Present Illness Rebecca Howe is a 73 year old female who presents for a routine follow-up and medication refill.  Anxiolytic use for dental procedures - Requests refill of Valium , used as needed for dental procedures  Hyperlipidemia management and cardiovascular risk concerns - Currently taking red yeast rice for cholesterol management - Fasting today for laboratory evaluation of cholesterol - Expresses concern about cholesterol levels due to friends experiencing strokes - Has established aortic atherosclerosis, Last LDL 138    Most recent fall risk assessment:    06/03/2023   12:43 PM  Fall Risk   Falls in the past year? 0  Number falls in past yr: 0  Injury with Fall? 0  Risk for fall due to : No Fall Risks  Follow up Falls prevention discussed;Falls evaluation completed     Most recent depression screenings:    06/03/2023   12:45 PM 11/20/2022    1:23 PM  PHQ 2/9 Scores  PHQ - 2 Score 0 0  PHQ- 9 Score 0       Past Medical History:  Diagnosis Date   Abnormal Pap smear of cervix    COVID 08/2018   Hearing loss since childhood   bilateral hearing loss   Hyperlipidemia    Phreesia 03/25/2020   Irregular menses    PONV (postoperative nausea and vomiting)    SCC (squamous cell carcinoma), arm, right    Skin lesion of left leg 01/2007   atypical   Past Surgical History:  Procedure Laterality Date   BREAST LUMPECTOMY Left 1995   benign   COLONOSCOPY  07/04/2005   recheck in 10 years - Dr Kristie   COLPOSCOPY  02/1996   benign   COLPOSCOPY  12/1998   benign    MASS EXCISION Right 11/18/2016   Procedure: EXCISIONAL BIOPSY MASS RIGHT LST WEB;  Surgeon: Murrell Kuba, MD;  Location: [facility];  Service: Orthopedics;  Laterality: Right;  REG/FAB   TUBAL LIGATION Bilateral 1991   Social History   Tobacco Use   Smoking status: Never    Passive exposure: Never   Smokeless tobacco: Never  Vaping Use   Vaping status: Never Used  Substance Use Topics   Alcohol use: No   Drug use: No   Family History  Problem Relation Age of Onset   Hypertension Father    Diabetes Paternal Grandmother    Hypertension Mother    Thyroid  disease Mother    Hypertension Brother    Cancer Paternal Aunt 52       colon cancer   Breast cancer Paternal Aunt    No Known Allergies    Patient Care Team: Elnor Lauraine BRAVO, NP as PCP - General (Nurse Practitioner) Cathlyn JAYSON Nikki Bobie BRAVO, MD as Consulting Physician (Obstetrics and Gynecology) Charmayne Molly, MD as Consulting Physician (Ophthalmology)   Outpatient Medications Prior to Visit  Medication Sig   acetaminophen  (TYLENOL ) 500 MG tablet Take 500 mg by mouth every 6 (six) hours as needed.   Calcium Carbonate-Vitamin D  600-5 MG-MCG TABS  SMARTSIG:1 By Mouth   CALCIUM PO Take by mouth.   hydrocortisone 2.5 % cream Apply topically as directed.   ibuprofen (ADVIL,MOTRIN) 200 MG tablet Take 200 mg by mouth every 6 (six) hours as needed.   Lactobacillus (PROBIOTIC ACIDOPHILUS PO) Take by mouth.   Multiple Vitamin (MULTIVITAMIN) tablet Take 1 tablet by mouth daily.   Red Yeast Rice Extract (RED YEAST RICE PO) Take by mouth.   Vitamin D , Cholecalciferol , 25 MCG (1000 UT) CAPS Take 1 capsule by mouth daily.   [DISCONTINUED] cephALEXin  (KEFLEX ) 500 MG capsule Take 1 capsule (500 mg total) by mouth 4 (four) times daily.   [DISCONTINUED] diazepam  (VALIUM ) 5 MG tablet Take 1 tablet 1 hour before dental procedure   Turmeric (QC TUMERIC COMPLEX PO) Take 1,200 capsules by mouth in the morning and at bedtime.   No  facility-administered medications prior to visit.    Review of Systems  Respiratory:  Negative for cough and wheezing.   Cardiovascular:  Negative for chest pain and palpitations.  Gastrointestinal:  Negative for abdominal pain and blood in stool.  Psychiatric/Behavioral:  Negative for depression. The patient is not nervous/anxious.           Objective:     BP 120/80   Pulse 75   Temp 98.1 F (36.7 C) (Temporal)   Ht 5' 7.5 (1.715 m)   Wt 165 lb (74.8 kg)   LMP 11/17/2001 (Approximate)   SpO2 96%   BMI 25.46 kg/m  BP Readings from Last 3 Encounters:  12/10/23 120/80  11/26/22 124/68  11/20/22 110/76   Wt Readings from Last 3 Encounters:  12/10/23 165 lb (74.8 kg)  06/03/23 163 lb (73.9 kg)  11/26/22 163 lb (73.9 kg)      Physical Exam Vitals reviewed.  Constitutional:      Appearance: Normal appearance.  HENT:     Head: Normocephalic and atraumatic.     Right Ear: Tympanic membrane, ear canal and external ear normal.     Left Ear: Tympanic membrane, ear canal and external ear normal.  Eyes:     General:        Right eye: No discharge.        Left eye: No discharge.     Extraocular Movements: Extraocular movements intact.     Conjunctiva/sclera: Conjunctivae normal.     Pupils: Pupils are equal, round, and reactive to light.  Neck:     Vascular: No carotid bruit.  Cardiovascular:     Rate and Rhythm: Normal rate and regular rhythm.     Pulses: Normal pulses.     Heart sounds: Normal heart sounds. No murmur heard. Pulmonary:     Effort: Pulmonary effort is normal.     Breath sounds: Normal breath sounds.  Chest:     Comments: Deferred per patient preference Abdominal:     General: Abdomen is flat. Bowel sounds are normal. There is no distension.     Palpations: Abdomen is soft. There is no mass.     Tenderness: There is no abdominal tenderness.  Musculoskeletal:        General: No tenderness.     Cervical back: Neck supple. No muscular tenderness.      Right lower leg: No edema.     Left lower leg: No edema.  Lymphadenopathy:     Cervical: No cervical adenopathy.     Upper Body:     Right upper body: No supraclavicular adenopathy.     Left upper body: No supraclavicular  adenopathy.  Skin:    General: Skin is warm and dry.  Neurological:     General: No focal deficit present.     Mental Status: She is alert and oriented to person, place, and time.     Motor: No weakness.     Gait: Gait normal.  Psychiatric:        Mood and Affect: Mood normal.        Behavior: Behavior normal.        Judgment: Judgment normal.      No results found for any visits on 12/10/23.     Assessment & Plan:    Routine Health Maintenance and Physical Exam  Immunization History  Administered Date(s) Administered   Fluad Quad(high Dose 65+) 11/18/2019, 11/21/2020, 11/12/2021   Fluad Trivalent(High Dose 65+) 12/02/2022   INFLUENZA, HIGH DOSE SEASONAL PF 11/27/2017, 11/23/2023   Influenza,inj,Quad PF,6+ Mos 11/25/2012, 11/17/2013, 12/05/2014, 12/06/2015, 11/11/2016, 12/06/2018   Pneumococcal Conjugate-13 01/20/2017   Pneumococcal Polysaccharide-23 03/23/2018   Tdap 07/19/2002, 07/29/2010, 07/23/2020   Zoster Recombinant(Shingrix) 03/06/2017, 05/18/2017   Zoster, Live 09/19/2011    Health Maintenance  Topic Date Due   Medicare Annual Wellness (AWV)  06/02/2024   Mammogram  11/04/2024   DEXA SCAN  03/01/2025   Colonoscopy  08/23/2025   DTaP/Tdap/Td (4 - Td or Tdap) 07/24/2030   Pneumococcal Vaccine: 50+ Years  Completed   Influenza Vaccine  Completed   Hepatitis C Screening  Completed   Zoster Vaccines- Shingrix  Completed   Meningococcal B Vaccine  Aged Out   COVID-19 Vaccine  Discontinued    Discussed health benefits of physical activity, and encouraged her to engage in regular exercise appropriate for her age and condition.  Problem List Items Addressed This Visit       Cardiovascular and Mediastinum   Aortic atherosclerosis    Hyperlipidemia with aortic atherosclerosis Managed with red yeast rice. Goal LDL <70 mg/dL to reduce myocardial infarction and cerebrovascular accident risk due to aortic plaques. - Order fasting lipid panel. - Consider alternative medications if LDL >70 mg/dL. - Offer follow-up appointment or virtual visit to discuss medication options if needed.      Relevant Orders   CBC   Comprehensive metabolic panel with GFR   Hemoglobin A1c   Lipid panel   TSH   VITAMIN D  25 Hydroxy (Vit-D Deficiency, Fractures)     Other   Hyperlipidemia   Hyperlipidemia with aortic atherosclerosis Managed with red yeast rice. Goal LDL <70 mg/dL to reduce myocardial infarction and cerebrovascular accident risk due to aortic plaques. - Order fasting lipid panel. - Consider alternative medications if LDL >70 mg/dL. - Offer follow-up appointment or virtual visit to discuss medication options if needed.      Relevant Orders   CBC   Comprehensive metabolic panel with GFR   Hemoglobin A1c   Lipid panel   TSH   VITAMIN D  25 Hydroxy (Vit-D Deficiency, Fractures)   Vitamin D  deficiency   Labs ordered, further recommendations may be made based upon his results       Relevant Orders   CBC   Comprehensive metabolic panel with GFR   Hemoglobin A1c   Lipid panel   TSH   VITAMIN D  25 Hydroxy (Vit-D Deficiency, Fractures)   Encounter for general adult medical examination with abnormal findings   Discussed routine screenings and handout provided.      Relevant Orders   CBC   Comprehensive metabolic panel with GFR   Hemoglobin A1c  Lipid panel   TSH   VITAMIN D  25 Hydroxy (Vit-D Deficiency, Fractures)   Anxiety about visiting dentist - Primary   Dental procedure-related anxiety (Valium  use) Experiences anxiety related to dental procedures. Requests new Valium  prescription. - Prescribe Valium  5 mg/day as needed for dental procedure-related anxiety. - Advise to have someone drive her to and from the  procedure due to potential sedation effects.      Relevant Medications   diazepam  (VALIUM ) 5 MG tablet   Assessment and Plan Assessment & Plan Hyperlipidemia with aortic atherosclerosis Managed with red yeast rice. Goal LDL <70 mg/dL to reduce myocardial infarction and cerebrovascular accident risk due to aortic plaques. - Order fasting lipid panel. - Consider alternative medications if LDL >70 mg/dL. - Offer follow-up appointment or virtual visit to discuss medication options if needed.  Dental procedure-related anxiety (Valium  use) Experiences anxiety related to dental procedures. Requests new Valium  prescription. - Prescribe Valium  5 mg/day as needed for dental procedure-related anxiety. - Advise to have someone drive her to and from the procedure due to potential sedation effects.  Annual Exam Discussed routine screenings and handout provided.  In addition to annual exam an office visit was ompleted as detailed above  Return in about 1 year (around 12/09/2024) for CPE with Macoy Rodwell.     Lauraine FORBES Pereyra, NP

## 2023-12-10 NOTE — Assessment & Plan Note (Signed)
 Labs ordered, further recommendations may be made based upon his results.

## 2023-12-10 NOTE — Assessment & Plan Note (Signed)
 Hyperlipidemia with aortic atherosclerosis Managed with red yeast rice. Goal LDL <70 mg/dL to reduce myocardial infarction and cerebrovascular accident risk due to aortic plaques. - Order fasting lipid panel. - Consider alternative medications if LDL >70 mg/dL. - Offer follow-up appointment or virtual visit to discuss medication options if needed.

## 2023-12-10 NOTE — Assessment & Plan Note (Signed)
 Dental procedure-related anxiety (Valium  use) Experiences anxiety related to dental procedures. Requests new Valium  prescription. - Prescribe Valium  5 mg/day as needed for dental procedure-related anxiety. - Advise to have someone drive her to and from the procedure due to potential sedation effects.

## 2023-12-11 ENCOUNTER — Ambulatory Visit: Payer: Self-pay | Admitting: Nurse Practitioner

## 2023-12-31 ENCOUNTER — Encounter: Payer: Self-pay | Admitting: Nurse Practitioner

## 2023-12-31 ENCOUNTER — Ambulatory Visit: Admitting: Nurse Practitioner

## 2023-12-31 VITALS — BP 124/80 | HR 80 | Temp 99.0°F | Ht 67.5 in | Wt 168.0 lb

## 2023-12-31 DIAGNOSIS — Z13220 Encounter for screening for lipoid disorders: Secondary | ICD-10-CM

## 2023-12-31 DIAGNOSIS — E7800 Pure hypercholesterolemia, unspecified: Secondary | ICD-10-CM | POA: Diagnosis not present

## 2023-12-31 DIAGNOSIS — Z136 Encounter for screening for cardiovascular disorders: Secondary | ICD-10-CM

## 2023-12-31 DIAGNOSIS — I700 Atherosclerosis of aorta: Secondary | ICD-10-CM

## 2023-12-31 NOTE — Progress Notes (Signed)
   Established Patient Office Visit  Subjective   [name], female    DOB: Mar 12, 1950  Age: 73 y.o. MRN: [id number]  Chief Complaint  Patient presents with   Hyperlipidemia    Discussed the use of AI scribe software for clinical note transcription with the patient, who gave verbal consent to proceed.  History of Present Illness Rebecca Howe is a 73 year old female with aortic atherosclerosis and hyperlipidemia who presents for follow-up on her cholesterol management.  Hyperlipidemia - LDL cholesterol level 143 mg/dL on recent fasting lipid panel - Cholesterol levels have been consistently elevated - ASCVD risk score 10.6% - Currently using red yeast rice for cholesterol management - Finds dietary management challenging, particularly with fried food and mayonnaise, but is willing to improve diet  Physical inactivity - Previously walked regularly but stopped after her dogs passed away - Acknowledges benefits of regular exercise and maintaining a healthy weight - Considering further lifestyle modifications  Aortic atherosclerosis - History of aortic atherosclerosis, asymptomatic      ROS: see HPI    Objective:     BP 124/80 (BP Location: Left Arm, Patient Position: Sitting, Cuff Size: Normal)   Pulse 80   Temp 99 F (37.2 C) (Oral)   Ht 5' 7.5 (1.715 m)   Wt 168 lb (76.2 kg)   LMP 11/17/2001 (Approximate)   SpO2 97%   BMI 25.92 kg/m    Physical Exam Vitals reviewed.  Constitutional:      General: She is not in acute distress.    Appearance: Normal appearance.  HENT:     Head: Normocephalic and atraumatic.  Cardiovascular:     Rate and Rhythm: Normal rate.     Pulses: Normal pulses.  Pulmonary:     Effort: Pulmonary effort is normal.  Skin:    General: Skin is warm and dry.  Neurological:     General: No focal deficit present.     Mental Status: She is alert and oriented to person, place, and time.  Psychiatric:        Mood and  Affect: Mood normal.        Behavior: Behavior normal.        Judgment: Judgment normal.      No results found for any visits on 12/31/23.    The 10-year ASCVD risk score (Arnett DK, et al., 2019) is: 11.3%    Assessment & Plan:   Problem List Items Addressed This Visit       Cardiovascular and Mediastinum   Aortic atherosclerosis - Primary     Other   Hyperlipidemia   Other Visit Diagnoses       Encounter for lipid screening for cardiovascular disease       Relevant Orders   CT CARDIAC SCORING (SELF PAY ONLY)      Assessment and Plan Assessment & Plan Aortic atherosclerosis and hyperlipidemia Chronic hyperlipidemia with LDL of 143 mg/dL and ASCVD risk score of 10.6%. Discussed genetic and dietary influences on cholesterol. Explained lifestyle modifications. Discussed calcium CT score for coronary artery calcium assessment. Explained CT scan risks and benefits. Discussed statin therapy and Repatha, including side effects and insurance. She opted for CT scan before medication. - Ordered calcium CT score. - Provided atorvastatin and Repatha handout. - Encouraged dietary modifications and regular exercise.    Return in about 3 months (around 04/01/2024) for F/U with Lauraine.    Lauraine FORBES Pereyra, NP

## 2023-12-31 NOTE — Assessment & Plan Note (Signed)
 Aortic atherosclerosis and hyperlipidemia Chronic hyperlipidemia with LDL of 143 mg/dL and ASCVD risk score of 10.6%. Discussed genetic and dietary influences on cholesterol. Explained lifestyle modifications. Discussed calcium CT score for coronary artery calcium assessment. Explained CT scan risks and benefits. Discussed statin therapy and Repatha, including side effects and insurance. She opted for CT scan before medication. - Ordered calcium CT score. - Provided atorvastatin and Repatha handout. - Encouraged dietary modifications and regular exercise.

## 2024-01-20 ENCOUNTER — Ambulatory Visit (HOSPITAL_BASED_OUTPATIENT_CLINIC_OR_DEPARTMENT_OTHER)
Admission: RE | Admit: 2024-01-20 | Discharge: 2024-01-20 | Disposition: A | Payer: Self-pay | Source: Ambulatory Visit | Attending: Nurse Practitioner | Admitting: Nurse Practitioner

## 2024-01-20 DIAGNOSIS — Z13220 Encounter for screening for lipoid disorders: Secondary | ICD-10-CM | POA: Insufficient documentation

## 2024-01-20 DIAGNOSIS — Z136 Encounter for screening for cardiovascular disorders: Secondary | ICD-10-CM | POA: Insufficient documentation

## 2024-01-25 ENCOUNTER — Ambulatory Visit: Payer: Self-pay | Admitting: Nurse Practitioner

## 2024-03-31 ENCOUNTER — Ambulatory Visit: Admitting: Nurse Practitioner

## 2024-06-09 ENCOUNTER — Ambulatory Visit

## 2024-07-07 ENCOUNTER — Ambulatory Visit: Admitting: Nurse Practitioner
